# Patient Record
Sex: Female | Born: 1935 | Hispanic: No | Marital: Married | Smoking: Never smoker
Health system: Southern US, Community
[De-identification: ages and names within clinical notes are randomized; demographics above are authoritative.]

## PROBLEM LIST (undated history)

## (undated) DIAGNOSIS — M1990 Unspecified osteoarthritis, unspecified site: Secondary | ICD-10-CM

## (undated) HISTORY — PX: CHOLECYSTECTOMY: SHX55

## (undated) HISTORY — PX: ECTOPIC PREGNANCY SURGERY: SHX613

## (undated) HISTORY — PX: ABDOMINAL HYSTERECTOMY: SHX81

## (undated) HISTORY — DX: Unspecified osteoarthritis, unspecified site: M19.90

---

## 2005-01-22 ENCOUNTER — Emergency Department (HOSPITAL_COMMUNITY): Admission: EM | Admit: 2005-01-22 | Discharge: 2005-01-22 | Payer: Self-pay | Admitting: Emergency Medicine

## 2010-08-15 ENCOUNTER — Emergency Department (HOSPITAL_COMMUNITY): Payer: No Typology Code available for payment source

## 2010-08-15 ENCOUNTER — Emergency Department (HOSPITAL_COMMUNITY)
Admission: EM | Admit: 2010-08-15 | Discharge: 2010-08-15 | Disposition: A | Discharge status: Home / Self Care | Payer: No Typology Code available for payment source | Attending: Emergency Medicine | Admitting: Emergency Medicine

## 2010-08-15 DIAGNOSIS — R071 Chest pain on breathing: Secondary | ICD-10-CM | POA: Insufficient documentation

## 2010-08-15 DIAGNOSIS — S2239XA Fracture of one rib, unspecified side, initial encounter for closed fracture: Secondary | ICD-10-CM | POA: Insufficient documentation

## 2010-08-15 DIAGNOSIS — Y929 Unspecified place or not applicable: Secondary | ICD-10-CM | POA: Insufficient documentation

## 2010-08-15 DIAGNOSIS — Z7982 Long term (current) use of aspirin: Secondary | ICD-10-CM | POA: Insufficient documentation

## 2010-08-15 LAB — URINE MICROSCOPIC-ADD ON

## 2010-08-15 LAB — CBC
HCT: 44.7 % (ref 36.0–46.0)
Hemoglobin: 15.1 g/dL — ABNORMAL HIGH (ref 12.0–15.0)
MCV: 97.4 fL (ref 78.0–100.0)
Platelets: 243 10*3/uL (ref 150–400)
RBC: 4.59 MIL/uL (ref 3.87–5.11)
WBC: 11.2 10*3/uL — ABNORMAL HIGH (ref 4.0–10.5)

## 2010-08-15 LAB — URINALYSIS, ROUTINE W REFLEX MICROSCOPIC
Bilirubin Urine: NEGATIVE
Hgb urine dipstick: NEGATIVE
Ketones, ur: 15 mg/dL — AB
Urine Glucose, Fasting: NEGATIVE mg/dL
Urobilinogen, UA: 1 mg/dL (ref 0.0–1.0)

## 2010-08-15 LAB — DIFFERENTIAL
Basophils Absolute: 0.1 10*3/uL (ref 0.0–0.1)
Neutro Abs: 6.6 10*3/uL (ref 1.7–7.7)

## 2010-08-15 LAB — POCT I-STAT, CHEM 8
Chloride: 103 mEq/L (ref 96–112)
Creatinine, Ser: 0.8 mg/dL (ref 0.4–1.2)
Potassium: 4.5 mEq/L (ref 3.5–5.1)
Sodium: 141 mEq/L (ref 135–145)

## 2010-08-16 LAB — URINE CULTURE: Culture  Setup Time: 201202082249

## 2012-08-24 ENCOUNTER — Ambulatory Visit (INDEPENDENT_AMBULATORY_CARE_PROVIDER_SITE_OTHER): Payer: Medicare Other | Admitting: Family Medicine

## 2012-08-24 ENCOUNTER — Ambulatory Visit: Payer: Medicare Other

## 2012-08-24 VITALS — BP 134/82 | HR 132 | Temp 97.7°F | Resp 20 | Ht 64.25 in | Wt 131.4 lb

## 2012-08-24 DIAGNOSIS — M25559 Pain in unspecified hip: Secondary | ICD-10-CM

## 2012-08-24 DIAGNOSIS — M25551 Pain in right hip: Secondary | ICD-10-CM

## 2012-08-24 DIAGNOSIS — S7000XA Contusion of unspecified hip, initial encounter: Secondary | ICD-10-CM

## 2012-08-24 DIAGNOSIS — M1610 Unilateral primary osteoarthritis, unspecified hip: Secondary | ICD-10-CM

## 2012-08-24 NOTE — Patient Instructions (Addendum)
Take Tylenol 500 mg (acetaminophen) 2 pills every 6 hours if needed for pain  Be very careful to not take another fall  Plan or return if in all worse

## 2012-08-24 NOTE — Progress Notes (Signed)
Subjective: 79 year old lady who has a history of some vertigo. Today she stood up was, was dizzy, and fell backwards landing on her tailbone. That happened around in today. Her husband was unable to get her out. He brought her here to the office. His son came over and helped lift her. She's had a number of falls but has never broken anything. She does walk with a walker. She is deaf but quite alert. She did not injure anything else.  Objective: No obvious bruising. She is tender on the right hip area. The sacrum and coccyx do not seem to decrease tender. The left side is fine. She is able to bear weight. I laid her down and examined the hips and she has adequate range of motion with no obvious pain on movement.  Assessment: Contusion right hip  Plan: X-ray hip  [facility] reading (PRIMARY) by  Dr. Alwyn Ren Degenerative arthritis.  Although right hip has had a lot of arthritic changes, I do not see an obvious fracture. .  Will get a radiologist read the film. The patient is able to ambulate well with a walker I do not think it is fractured, but I will see what he says.. Radiologist did not see a fracture either. Treat symptomatically.

## 2013-03-12 ENCOUNTER — Emergency Department (HOSPITAL_COMMUNITY)
Admission: EM | Admit: 2013-03-12 | Discharge: 2013-03-12 | Disposition: A | Discharge status: Home / Self Care | Payer: Medicare Other | Attending: Emergency Medicine | Admitting: Emergency Medicine

## 2013-03-12 ENCOUNTER — Encounter (HOSPITAL_COMMUNITY): Payer: Self-pay

## 2013-03-12 ENCOUNTER — Emergency Department (HOSPITAL_COMMUNITY): Payer: Medicare Other

## 2013-03-12 DIAGNOSIS — W19XXXA Unspecified fall, initial encounter: Secondary | ICD-10-CM | POA: Insufficient documentation

## 2013-03-12 DIAGNOSIS — Y9302 Activity, running: Secondary | ICD-10-CM | POA: Insufficient documentation

## 2013-03-12 DIAGNOSIS — R94110 Abnormal electro-oculogram [EOG]: Secondary | ICD-10-CM | POA: Insufficient documentation

## 2013-03-12 DIAGNOSIS — S22009A Unspecified fracture of unspecified thoracic vertebra, initial encounter for closed fracture: Secondary | ICD-10-CM | POA: Insufficient documentation

## 2013-03-12 DIAGNOSIS — R9431 Abnormal electrocardiogram [ECG] [EKG]: Secondary | ICD-10-CM

## 2013-03-12 DIAGNOSIS — Y92009 Unspecified place in unspecified non-institutional (private) residence as the place of occurrence of the external cause: Secondary | ICD-10-CM | POA: Insufficient documentation

## 2013-03-12 DIAGNOSIS — Z8739 Personal history of other diseases of the musculoskeletal system and connective tissue: Secondary | ICD-10-CM | POA: Insufficient documentation

## 2013-03-12 DIAGNOSIS — R269 Unspecified abnormalities of gait and mobility: Secondary | ICD-10-CM | POA: Insufficient documentation

## 2013-03-12 LAB — COMPREHENSIVE METABOLIC PANEL
Albumin: 3.3 g/dL — ABNORMAL LOW (ref 3.5–5.2)
CO2: 26 mEq/L (ref 19–32)
Calcium: 9.1 mg/dL (ref 8.4–10.5)
Chloride: 100 mEq/L (ref 96–112)
Glucose, Bld: 111 mg/dL — ABNORMAL HIGH (ref 70–99)
Potassium: 3.2 mEq/L — ABNORMAL LOW (ref 3.5–5.1)
Sodium: 139 mEq/L (ref 135–145)

## 2013-03-12 LAB — CBC
HCT: 41.9 % (ref 36.0–46.0)
Hemoglobin: 14.1 g/dL (ref 12.0–15.0)
MCH: 32.7 pg (ref 26.0–34.0)
RBC: 4.31 MIL/uL (ref 3.87–5.11)

## 2013-03-12 LAB — POCT I-STAT, CHEM 8
Calcium, Ion: 1.13 mmol/L (ref 1.13–1.30)
Glucose, Bld: 107 mg/dL — ABNORMAL HIGH (ref 70–99)
Hemoglobin: 15.3 g/dL — ABNORMAL HIGH (ref 12.0–15.0)
Potassium: 3.5 mEq/L (ref 3.5–5.1)
TCO2: 29 mmol/L (ref 0–100)

## 2013-03-12 LAB — URINALYSIS, ROUTINE W REFLEX MICROSCOPIC
Nitrite: NEGATIVE
Specific Gravity, Urine: 1.016 (ref 1.005–1.030)
Urobilinogen, UA: 1 mg/dL (ref 0.0–1.0)

## 2013-03-12 LAB — POCT I-STAT TROPONIN I: Troponin i, poc: 0 ng/mL (ref 0.00–0.08)

## 2013-03-12 MED ORDER — HYDROCODONE-ACETAMINOPHEN 5-325 MG PO TABS: 1.0000 | ORAL_TABLET | ORAL | Status: DC | PRN

## 2013-03-12 MED ORDER — POTASSIUM CHLORIDE CRYS ER 20 MEQ PO TBCR
40.0000 meq | EXTENDED_RELEASE_TABLET | Freq: Once | ORAL | Status: AC
  Administered 2013-03-12: 40 meq via ORAL
  Filled 2013-03-12: qty 2

## 2013-03-12 NOTE — ED Notes (Signed)
Bed: ZO10 Expected date:  Expected time:  Means of arrival:  Comments: 79yo-fall

## 2013-03-12 NOTE — ED Notes (Signed)
cbg 115 

## 2013-03-12 NOTE — ED Notes (Signed)
Patient transported to X-ray 

## 2013-03-12 NOTE — ED Notes (Signed)
Patient unable to stand up long enough to get a standing BP

## 2013-03-12 NOTE — ED Notes (Signed)
No pain upon palpation of neck and spine. Intact c-collar placed by EMS.  Right knee is swollen. As long as pt is still she has no pain.

## 2013-03-12 NOTE — ED Notes (Signed)
Per EMS patient had 2 falls today witnessed by husband. States that husband said that her leg just kind of gave out and her knees buckled. No indication that she hit her head. No bruises or lumps noted on head. When patient moves she has lower back pain. Swelling and bruising noted on her right knee. States that the swelling on knee is chronic. Left leg looks as though rotated outward but no pain.

## 2013-03-12 NOTE — ED Provider Notes (Signed)
CSN: [id number]     Arrival date & time 03/12/13  0804 History   First MD Initiated Contact with Patient 03/12/13 0820     Chief Complaint  Patient presents with  . Fall   (Consider location/radiation/quality/duration/timing/severity/associated sxs/prior Treatment) Patient is a 79 y.o. female presenting with fall. The history is provided by the patient and the spouse.  Fall   patient here after sustaining a fall at home just prior to arrival. States that she was walking her legs became suddenly weak. She did not strike her head. She denies having loss of consciousness. Pain in her lower back is characterized as sharp and worse with movement. Denies any saddle anesthesia. No numbness or tingling in her legs. Denies any recent illnesses. Called EMS and was transported here. No treatment used prior to arrival  Past Medical History  Diagnosis Date  . Arthritis    Past Surgical History  Procedure Laterality Date  . Cholecystectomy    . Abdominal hysterectomy    . Ectopic pregnancy surgery     No family history on file. History  Substance Use Topics  . Smoking status: Never Smoker   . Smokeless tobacco: Never Used  . Alcohol Use: 0.6 oz/week    1 Glasses of wine per week     Comment: occassional   OB History   Grav Para Term Preterm Abortions TAB SAB Ect Mult Living                 Review of Systems  All other systems reviewed and are negative.    Allergies  Review of patient's allergies indicates no known allergies.  Home Medications   Current Outpatient Rx  Name  Route  Sig  Dispense  Refill  . acetaminophen (TYLENOL) 500 MG tablet   Oral   Take 500 mg by mouth every 6 (six) hours as needed for pain.          BP 142/67  Pulse 90  Temp(Src) 98.3 F (36.8 C) (Oral)  Resp 20  SpO2 95% Physical Exam  Nursing note and vitals reviewed. Constitutional: She is oriented to person, place, and time. She appears well-developed and well-nourished.  Non-toxic appearance.  No distress.  HENT:  Head: Normocephalic and atraumatic.  Eyes: Conjunctivae, EOM and lids are normal. Pupils are equal, round, and reactive to light.  Neck: Normal range of motion. Neck supple. No tracheal deviation present. No mass present.  Cardiovascular: Normal rate, regular rhythm and normal heart sounds.  Exam reveals no gallop.   No murmur heard. Pulmonary/Chest: Effort normal and breath sounds normal. No stridor. No respiratory distress. She has no decreased breath sounds. She has no wheezes. She has no rhonchi. She has no rales.  Abdominal: Soft. Normal appearance and bowel sounds are normal. She exhibits no distension. There is no tenderness. There is no rebound and no CVA tenderness.  Musculoskeletal: Normal range of motion. She exhibits no edema and no tenderness.       Back:  Neurological: She is alert and oriented to person, place, and time. She has normal strength. No cranial nerve deficit or sensory deficit. GCS eye subscore is 4. GCS verbal subscore is 5. GCS motor subscore is 6.  Skin: Skin is warm and dry. No abrasion and no rash noted.  Psychiatric: She has a normal mood and affect. Her speech is normal and behavior is normal.    ED Course  Procedures (including critical care time) Labs Review Labs Reviewed  URINALYSIS, ROUTINE W REFLEX  MICROSCOPIC   Imaging Review No results found.  MDM  No diagnosis found.  Date: 03/12/2013  Rate: 90  Rhythm: normal sinus rhythm  QRS Axis: normal  Intervals: normal  ST/T Wave abnormalities: nonspecific T wave changes  Conduction Disutrbances:none  Narrative Interpretation:   Old EKG Reviewed: none available   Patient's potassium replaced here. Patient has no cardiac complaints of at this time. He has not seen a physician for an over 40 years. Cardiac enzymes negative here. UA negative as well 2. Patient will be given cardiology referral for her abnormal EKG   Patient informed of x-ray results of her thoracic  spine.  Toy Baker, MD 03/12/13 [phone]

## 2013-07-09 ENCOUNTER — Emergency Department (HOSPITAL_COMMUNITY): Payer: Medicare Other

## 2013-07-09 ENCOUNTER — Encounter (HOSPITAL_COMMUNITY): Payer: Self-pay | Admitting: Emergency Medicine

## 2013-07-09 ENCOUNTER — Inpatient Hospital Stay (HOSPITAL_COMMUNITY)
Admission: EM | Admit: 2013-07-09 | Discharge: 2013-07-14 | DRG: 690 | Disposition: A | Discharge status: Skilled Nursing Facility | Payer: Medicare Other | Attending: Internal Medicine | Admitting: Internal Medicine

## 2013-07-09 DIAGNOSIS — N183 Chronic kidney disease, stage 3 unspecified: Secondary | ICD-10-CM | POA: Diagnosis present

## 2013-07-09 DIAGNOSIS — W19XXXD Unspecified fall, subsequent encounter: Secondary | ICD-10-CM

## 2013-07-09 DIAGNOSIS — I252 Old myocardial infarction: Secondary | ICD-10-CM

## 2013-07-09 DIAGNOSIS — I2510 Atherosclerotic heart disease of native coronary artery without angina pectoris: Secondary | ICD-10-CM

## 2013-07-09 DIAGNOSIS — R531 Weakness: Secondary | ICD-10-CM | POA: Diagnosis present

## 2013-07-09 DIAGNOSIS — R627 Adult failure to thrive: Secondary | ICD-10-CM | POA: Diagnosis present

## 2013-07-09 DIAGNOSIS — F0390 Unspecified dementia without behavioral disturbance: Secondary | ICD-10-CM | POA: Diagnosis present

## 2013-07-09 DIAGNOSIS — N390 Urinary tract infection, site not specified: Principal | ICD-10-CM

## 2013-07-09 DIAGNOSIS — A498 Other bacterial infections of unspecified site: Secondary | ICD-10-CM | POA: Diagnosis present

## 2013-07-09 DIAGNOSIS — J0130 Acute sphenoidal sinusitis, unspecified: Secondary | ICD-10-CM | POA: Diagnosis present

## 2013-07-09 DIAGNOSIS — R5381 Other malaise: Secondary | ICD-10-CM

## 2013-07-09 DIAGNOSIS — N6310 Unspecified lump in the right breast, unspecified quadrant: Secondary | ICD-10-CM

## 2013-07-09 DIAGNOSIS — N63 Unspecified lump in unspecified breast: Secondary | ICD-10-CM | POA: Diagnosis present

## 2013-07-09 DIAGNOSIS — E860 Dehydration: Secondary | ICD-10-CM

## 2013-07-09 DIAGNOSIS — G912 (Idiopathic) normal pressure hydrocephalus: Secondary | ICD-10-CM

## 2013-07-09 DIAGNOSIS — E46 Unspecified protein-calorie malnutrition: Secondary | ICD-10-CM

## 2013-07-09 DIAGNOSIS — Z79899 Other long term (current) drug therapy: Secondary | ICD-10-CM

## 2013-07-09 DIAGNOSIS — R5383 Other fatigue: Secondary | ICD-10-CM

## 2013-07-09 DIAGNOSIS — I700 Atherosclerosis of aorta: Secondary | ICD-10-CM | POA: Diagnosis present

## 2013-07-09 DIAGNOSIS — B9620 Unspecified Escherichia coli [E. coli] as the cause of diseases classified elsewhere: Secondary | ICD-10-CM

## 2013-07-09 DIAGNOSIS — J479 Bronchiectasis, uncomplicated: Secondary | ICD-10-CM

## 2013-07-09 DIAGNOSIS — R918 Other nonspecific abnormal finding of lung field: Secondary | ICD-10-CM

## 2013-07-09 DIAGNOSIS — R269 Unspecified abnormalities of gait and mobility: Secondary | ICD-10-CM | POA: Diagnosis present

## 2013-07-09 DIAGNOSIS — W19XXXA Unspecified fall, initial encounter: Secondary | ICD-10-CM

## 2013-07-09 DIAGNOSIS — I498 Other specified cardiac arrhythmias: Secondary | ICD-10-CM | POA: Diagnosis present

## 2013-07-09 DIAGNOSIS — M129 Arthropathy, unspecified: Secondary | ICD-10-CM | POA: Diagnosis present

## 2013-07-09 DIAGNOSIS — IMO0002 Reserved for concepts with insufficient information to code with codable children: Secondary | ICD-10-CM

## 2013-07-09 DIAGNOSIS — E440 Moderate protein-calorie malnutrition: Secondary | ICD-10-CM | POA: Diagnosis present

## 2013-07-09 LAB — URINALYSIS, ROUTINE W REFLEX MICROSCOPIC
Glucose, UA: NEGATIVE mg/dL
Ketones, ur: NEGATIVE mg/dL
Leukocytes, UA: NEGATIVE
NITRITE: NEGATIVE
PROTEIN: NEGATIVE mg/dL
SPECIFIC GRAVITY, URINE: 1.023 (ref 1.005–1.030)
UROBILINOGEN UA: 1 mg/dL (ref 0.0–1.0)
pH: 6.5 (ref 5.0–8.0)

## 2013-07-09 LAB — CBC WITH DIFFERENTIAL/PLATELET
BASOS ABS: 0 10*3/uL (ref 0.0–0.1)
BASOS PCT: 0 % (ref 0–1)
EOS ABS: 0 10*3/uL (ref 0.0–0.7)
EOS PCT: 0 % (ref 0–5)
HCT: 44 % (ref 36.0–46.0)
Hemoglobin: 14.7 g/dL (ref 12.0–15.0)
LYMPHS ABS: 1.1 10*3/uL (ref 0.7–4.0)
Lymphocytes Relative: 15 % (ref 12–46)
MCH: 32.7 pg (ref 26.0–34.0)
MCHC: 33.4 g/dL (ref 30.0–36.0)
MCV: 97.8 fL (ref 78.0–100.0)
Monocytes Absolute: 0.5 10*3/uL (ref 0.1–1.0)
Monocytes Relative: 7 % (ref 3–12)
NEUTROS PCT: 78 % — AB (ref 43–77)
Neutro Abs: 5.5 10*3/uL (ref 1.7–7.7)
PLATELETS: 196 10*3/uL (ref 150–400)
RBC: 4.5 MIL/uL (ref 3.87–5.11)
RDW: 13.1 % (ref 11.5–15.5)
WBC: 7.1 10*3/uL (ref 4.0–10.5)

## 2013-07-09 LAB — COMPREHENSIVE METABOLIC PANEL
ALBUMIN: 3.4 g/dL — AB (ref 3.5–5.2)
ALK PHOS: 68 U/L (ref 39–117)
ALT: 24 U/L (ref 0–35)
AST: 42 U/L — AB (ref 0–37)
BUN: 11 mg/dL (ref 6–23)
CALCIUM: 8.7 mg/dL (ref 8.4–10.5)
CO2: 27 mEq/L (ref 19–32)
Chloride: 95 mEq/L — ABNORMAL LOW (ref 96–112)
Creatinine, Ser: 0.57 mg/dL (ref 0.50–1.10)
GFR calc non Af Amer: 77 mL/min — ABNORMAL LOW (ref >90)
GFR, EST AFRICAN AMERICAN: 90 mL/min — AB (ref >90)
Glucose, Bld: 109 mg/dL — ABNORMAL HIGH (ref 70–99)
POTASSIUM: 3.8 meq/L (ref 3.7–5.3)
SODIUM: 135 meq/L — AB (ref 137–147)
TOTAL PROTEIN: 7.2 g/dL (ref 6.0–8.3)
Total Bilirubin: 0.5 mg/dL (ref 0.3–1.2)

## 2013-07-09 LAB — URINE MICROSCOPIC-ADD ON

## 2013-07-09 LAB — PROTIME-INR
INR: 0.97 (ref 0.00–1.49)
Prothrombin Time: 12.7 seconds (ref 11.6–15.2)

## 2013-07-09 LAB — POCT I-STAT TROPONIN I: TROPONIN I, POC: 0 ng/mL (ref 0.00–0.08)

## 2013-07-09 MED ORDER — SODIUM CHLORIDE 0.9 % IJ SOLN
3.0000 mL | Freq: Two times a day (BID) | INTRAMUSCULAR | Status: DC
  Administered 2013-07-10 – 2013-07-12 (×3): 3 mL via INTRAVENOUS

## 2013-07-09 MED ORDER — SODIUM CHLORIDE 0.9 % IV SOLN: 1000.0000 mL | INTRAVENOUS | Status: DC

## 2013-07-09 MED ORDER — BOOST PLUS PO LIQD
237.0000 mL | Freq: Three times a day (TID) | ORAL | Status: DC
  Administered 2013-07-10 – 2013-07-14 (×8): 237 mL via ORAL
  Filled 2013-07-09 (×15): qty 237

## 2013-07-09 MED ORDER — ENOXAPARIN SODIUM 40 MG/0.4ML ~~LOC~~ SOLN
40.0000 mg | Freq: Every day | SUBCUTANEOUS | Status: DC
  Administered 2013-07-10 – 2013-07-11 (×2): 40 mg via SUBCUTANEOUS
  Filled 2013-07-09 (×4): qty 0.4

## 2013-07-09 MED ORDER — ACETAMINOPHEN 650 MG RE SUPP
650.0000 mg | Freq: Four times a day (QID) | RECTAL | Status: DC | PRN
Start: 2013-07-09 — End: 2013-07-14

## 2013-07-09 MED ORDER — DOCUSATE SODIUM 100 MG PO CAPS
100.0000 mg | ORAL_CAPSULE | Freq: Two times a day (BID) | ORAL | Status: DC
  Administered 2013-07-10 – 2013-07-14 (×8): 100 mg via ORAL
  Filled 2013-07-09 (×11): qty 1

## 2013-07-09 MED ORDER — SODIUM CHLORIDE 0.9 % IV SOLN
1000.0000 mL | Freq: Once | INTRAVENOUS | Status: AC
  Administered 2013-07-09: 1000 mL via INTRAVENOUS

## 2013-07-09 MED ORDER — POTASSIUM CHLORIDE IN NACL 20-0.9 MEQ/L-% IV SOLN
INTRAVENOUS | Status: DC
  Administered 2013-07-10: 100 mL/h via INTRAVENOUS
  Administered 2013-07-10 – 2013-07-11 (×3): via INTRAVENOUS
  Administered 2013-07-13: 21:00:00 20 mL/h via INTRAVENOUS
  Filled 2013-07-09 (×6): qty 1000

## 2013-07-09 MED ORDER — ACETAMINOPHEN 325 MG PO TABS: 650.0000 mg | ORAL_TABLET | Freq: Four times a day (QID) | ORAL | Status: DC | PRN

## 2013-07-09 NOTE — ED Notes (Signed)
Patient had an incontinent brief on and had caked old stool and multiple incontinent urines. Patient has red buttocks and a small split in her coccyx area. Patient confused x 3 oriented to self only.

## 2013-07-09 NOTE — H&P (Signed)
[facility] History and Physical  Jeanette Hudson ZOX:[id number] DOB: Dec 12, 1917 DOA: 07/09/2013  Referring physician: Dr. Lynelle Doctor, ED PCP: Janace Hoard, MD (urgent care) Specialists: none  Chief Complaint: Weakness and fall  HPI: Jeanette Hudson is a 80 y.o. female  This is a delightful 80 year old lady who comes to [place] today with a one-day history of weakness. At baseline she and her husband live alone and their son lives 5 minutes away. She is able to get around with a walker and participate in her ADLs. This morning at 4 AM she got out of bed and her husband was helping her with the aid of her walker to the restroom. She became weak and "melted" to the floor. Her husband says she did not fall. It was more like she sat down. There was no head injury. The son came to the house within 10 minutes and got his mom back to bed. Patient went back to sleep. Mid morning when she woke up her husband helped her but we'll in her in dealing Rocker to her chair. She spent the day in the chair which is not abnormal for her. However in the late afternoon she was unable to get up. Her son came and tried to get her to stand up but she was unable to even help him to help her get up. At that time he thought she stable more confused and slightly lethargic. EMS was called the patient was transported to the emergency room.   Review of Systems: The patient denies anorexia, fever, weight loss,, vision loss, decreased hearing, hoarseness, chest pain, syncope, dyspnea on exertion, peripheral edema, balance deficits, hemoptysis, abdominal pain, melena, hematochezia, severe indigestion/heartburn, hematuria, incontinence, genital sores, suspicious skin lesions, transient blindness, difficulty walking, depression, unusual weight change, abnormal bleeding, enlarged lymph nodes, angioedema, and breast masses.    Past Medical History  Diagnosis Date  . Arthritis    Past Surgical History  Procedure Laterality Date  .  Cholecystectomy    . Abdominal hysterectomy    . Ectopic pregnancy surgery     Social History:  reports that she has never smoked. She has never used smokeless tobacco. She reports that she drinks about 0.6 ounces of alcohol per week. She reports that she does not use illicit drugs. Patient lives with her husband. Family is nearby. She does most of her ADLs by herself and husband and son help her with her  No Known Allergies  History reviewed. No pertinent family history.   Prior to Admission medications   Medication Sig Start Date End Date Taking? Authorizing Provider  acetaminophen (TYLENOL) 500 MG tablet Take 500 mg by mouth every 6 (six) hours as needed for pain.   Yes Historical Provider, MD  POTASSIUM PO Take 1 tablet by mouth daily. Over the counter.   Yes Historical Provider, MD   Physical Exam: Filed Vitals:   07/09/13 1929  BP: 123/64  Pulse: 80  Temp: 98.3 F (36.8 C)  Resp: 16    Nursing note and vitals reviewed. Constitutional: She is oriented to person, place, and time. She appears well-developed and well-nourished.  HENT:  Nose: Nose normal.  Mouth/Throat: Oropharynx is clear and moist. No oropharyngeal exudate.  Eyes: Conjunctivae are normal. Pupils are equal, round, and reactive to light.  Neck: Normal range of motion. Neck supple. No thyromegaly present.  Cardiovascular: Normal rate, regular rhythm and normal heart sounds.   Pulmonary/Chest: Effort normal and breath sounds normal.  Abdominal: Soft. Bowel sounds are normal. She exhibits no  distension. There is no tenderness. There is no rebound.  Lymphadenopathy:    She has no cervical adenopathy.  Neurological: She is alert and oriented to person She has normal reflexes.  she is not able to toe may where she is, the [place], or the month Skin: Skin is warm and dry.  Psychiatric: She has a normal mood and affect. Her behavior is normal.    Labs on Admission:  Basic Metabolic  Panel:  Recent Labs Lab 07/09/13 1900  NA 135*  K 3.8  CL 95*  CO2 27  GLUCOSE 109*  BUN 11  CREATININE 0.57  CALCIUM 8.7   Liver Function Tests:  Recent Labs Lab 07/09/13 1900  AST 42*  ALT 24  ALKPHOS 68  BILITOT 0.5  PROT 7.2  ALBUMIN 3.4*   No results found for this basename: LIPASE, AMYLASE,  in the last 168 hours No results found for this basename: AMMONIA,  in the last 168 hours CBC:  Recent Labs Lab 07/09/13 1900  WBC 7.1  NEUTROABS 5.5  HGB 14.7  HCT 44.0  MCV 97.8  PLT 196   Cardiac Enzymes: No results found for this basename: CKTOTAL, CKMB, CKMBINDEX, TROPONINI,  in the last 168 hours  BNP (last 3 results) No results found for this basename: PROBNP,  in the last 8760 hours CBG: No results found for this basename: GLUCAP,  in the last 168 hours  Radiological Exams on Admission: Dg Chest 2 View  07/09/2013   CLINICAL DATA:  Fall, nonsmoker  EXAM: CHEST  2 VIEW  COMPARISON:  08/15/2010  FINDINGS: Heart size is normal. Vascular pattern is normal. There is aortic calcification. No consolidation or effusion. In the retrocardiac left lower lobe there are 2 nodular opacities which are not seen on the prior study, both measuring about 7 mm.  IMPRESSION: No acute findings. However, cannot exclude pulmonary nodules in the left lower lobe. Consider CT thorax.   Electronically Signed   By: Esperanza Heiraymond  Rubner M.D.   On: 07/09/2013 19:04   Ct Head Wo Contrast  07/09/2013   CLINICAL DATA:  Fall. Abrasion to the left cheek. Weakness. Altered level of consciousness.  EXAM: CT HEAD WITHOUT CONTRAST  TECHNIQUE: Contiguous axial images were obtained from the base of the skull through the vertex without intravenous contrast.  COMPARISON:  03/12/2013.  FINDINGS: The brainstem, cerebellum, cerebral peduncles, thalamus, basal ganglia, basilar cisterns, and ventricular system appear within normal limits. Periventricular white matter and corona radiata hypodensities favor chronic  ischemic microvascular white matter disease. No intracranial hemorrhage, mass lesion, or acute CVA. Chronic maxillary and ethmoid sinusitis noted with acute right sphenoid sinusitis.  IMPRESSION: 1. No acute intracranial findings. 2. Periventricular white matter and corona radiata hypodensities favor chronic ischemic microvascular white matter disease. 3. Acute sphenoid sinusitis with chronic maxillary and ethmoid sinusitis.   Electronically Signed   By: Herbie BaltimoreWalt  Liebkemann M.D.   On: 07/09/2013 19:08    EKG: Independently reviewed.sinus with lad  Assessment/Plan Active Problems:   Weakness   Fall   Protein calorie malnutrition   Mild dehydration   Pulmonary nodules   I am concerned that this may patient may have had a stroke given the abruptness of the new symptoms. Will get an MRI and would consider carotid Dopplers, TCD, echo and statin.  Will have physical therapy see her, and exercise fall precautions. Will supplement what she eats with boost shakes to help the protein calorie malnutrition. Overnight we'll give her some  IV fluids to help with the mild dehydration.  On chest x-ray there were some pulmonary nodules and a CT was recommended to follow this up. I have ordered the CT thorax for the left lower lobe pulmonary nodules.   Code Status: Full Family Communication: Spoke with the son and the patient's husband Disposition Plan: Admit to telemetry in particular to monitor for any occult arrhythmias  Time spent: 1 hour  Acey Lav [facility] Pager [phone]  If 7PM-7AM, please contact night-coverage [email or web address] Password [email or web address] 07/09/2013, 11:11 PM

## 2013-07-09 NOTE — ED Provider Notes (Signed)
CSN: [id number]     Arrival date & time 07/09/13  1709 History   First MD Initiated Contact with Patient 07/09/13 1809     Chief Complaint  Patient presents with  . Fall  . Weakness    HPI Pt resides at home with family.  She has been declining over years but last night she did not sleep well until 4am.  At some point in the night she slipped and fell.  Family members helped her get back into bed.  No vomiting or diarrhea.  No chest pain or abdominal pain but she has been complaining of having pain all over.  Later in the day she was helped to sit in a chair.   She called ask for help to go to the bathroom. She was unable to get to the bathroom today with assistance and she was incontinent. Family called 911 to have her brought to the emergency room for evaluation. Patient does admit to having some cough. She has not noticed any urinary frequency.  EMS found the patient to be smelling of urine. No fevers.   Past Medical History  Diagnosis Date  . Arthritis    Past Surgical History  Procedure Laterality Date  . Cholecystectomy    . Abdominal hysterectomy    . Ectopic pregnancy surgery     History reviewed. No pertinent family history. History  Substance Use Topics  . Smoking status: Never Smoker   . Smokeless tobacco: Never Used  . Alcohol Use: 0.6 oz/week    1 Glasses of wine per week     Comment: occassional   OB History   Grav Para Term Preterm Abortions TAB SAB Ect Mult Living                 Review of Systems  Constitutional: Positive for fatigue.  Respiratory: Negative for cough and shortness of breath.   Gastrointestinal: Negative for vomiting and diarrhea.  All other systems reviewed and are negative.    Allergies  Review of patient's allergies indicates no known allergies.  Home Medications   Current Outpatient Rx  Name  Route  Sig  Dispense  Refill  . acetaminophen (TYLENOL) 500 MG tablet   Oral   Take 500 mg by mouth every 6 (six) hours as needed for  pain.         Marland Kitchen POTASSIUM PO   Oral   Take 1 tablet by mouth daily. Over the counter.          BP 123/64  Pulse 80  Temp(Src) 98.3 F (36.8 C) (Oral)  Resp 16  SpO2 93% Physical Exam  Nursing note and vitals reviewed. Constitutional: No distress.  HENT:  Head: Normocephalic and atraumatic.  Right Ear: External ear normal.  Left Ear: External ear normal.  Mouth/Throat: No oropharyngeal exudate (mucous membranes are dry).  Eyes: Conjunctivae are normal. Right eye exhibits no discharge. Left eye exhibits no discharge. No scleral icterus.  Neck: Neck supple. No tracheal deviation present.  Cardiovascular: Normal rate, regular rhythm and intact distal pulses.   Pulmonary/Chest: Effort normal and breath sounds normal. No stridor. No respiratory distress. She has no wheezes. She has no rales.  Abdominal: Soft. Bowel sounds are normal. She exhibits no distension. There is no tenderness. There is no rebound and no guarding.  Genitourinary:  No decubitus ulcers noted, dry stool into the skin noted in the perineum  Musculoskeletal: She exhibits no edema and no tenderness.  Neurological: She is alert. No sensory deficit.  Cranial nerve deficit: No facial droop, extraocular movements intact. She exhibits normal muscle tone. She displays no seizure activity. Coordination abnormal.   General weakness, patient has some difficulty rolling onto her side on her own, unable to stand even with assistance  Skin: Skin is warm and dry. No rash noted. She is not diaphoretic.  Psychiatric: She has a normal mood and affect.    ED Course  Procedures (including critical care time) Labs Review Labs Reviewed  URINALYSIS, ROUTINE W REFLEX MICROSCOPIC - Abnormal; Notable for the following:    Color, Urine AMBER (*)    Hgb urine dipstick TRACE (*)    Bilirubin Urine SMALL (*)    All other components within normal limits  CBC WITH DIFFERENTIAL - Abnormal; Notable for the following:    Neutrophils  Relative % 78 (*)    All other components within normal limits  COMPREHENSIVE METABOLIC PANEL - Abnormal; Notable for the following:    Sodium 135 (*)    Chloride 95 (*)    Glucose, Bld 109 (*)    Albumin 3.4 (*)    AST 42 (*)    GFR calc non Af Amer 77 (*)    GFR calc Af Amer 90 (*)    All other components within normal limits  PROTIME-INR  URINE MICROSCOPIC-ADD ON  POCT I-STAT TROPONIN I   Imaging Review Dg Chest 2 View  07/09/2013   CLINICAL DATA:  Fall, nonsmoker  EXAM: CHEST  2 VIEW  COMPARISON:  08/15/2010  FINDINGS: Heart size is normal. Vascular pattern is normal. There is aortic calcification. No consolidation or effusion. In the retrocardiac left lower lobe there are 2 nodular opacities which are not seen on the prior study, both measuring about 7 mm.  IMPRESSION: No acute findings. However, cannot exclude pulmonary nodules in the left lower lobe. Consider CT thorax.   Electronically Signed   By: Esperanza Heir M.D.   On: 07/09/2013 19:04   Ct Head Wo Contrast  07/09/2013   CLINICAL DATA:  Fall. Abrasion to the left cheek. Weakness. Altered level of consciousness.  EXAM: CT HEAD WITHOUT CONTRAST  TECHNIQUE: Contiguous axial images were obtained from the base of the skull through the vertex without intravenous contrast.  COMPARISON:  03/12/2013.  FINDINGS: The brainstem, cerebellum, cerebral peduncles, thalamus, basal ganglia, basilar cisterns, and ventricular system appear within normal limits. Periventricular white matter and corona radiata hypodensities favor chronic ischemic microvascular white matter disease. No intracranial hemorrhage, mass lesion, or acute CVA. Chronic maxillary and ethmoid sinusitis noted with acute right sphenoid sinusitis.  IMPRESSION: 1. No acute intracranial findings. 2. Periventricular white matter and corona radiata hypodensities favor chronic ischemic microvascular white matter disease. 3. Acute sphenoid sinusitis with chronic maxillary and ethmoid  sinusitis.   Electronically Signed   By: Herbie Baltimore M.D.   On: 07/09/2013 19:08    EKG Interpretation    Date/Time:  Friday July 09 2013 19:06:19 EST Ventricular Rate:  83 PR Interval:  134 QRS Duration: 105 QT Interval:  377 QTC Calculation: 443 R Axis:   -39 Text Interpretation:  Sinus rhythm Left axis deviation Borderline repolarization abnormality Artifact in lead(s) I II aVR aVL aVF Confirmed by Skylene Deremer  MD-J, Lenore Moyano ([id number]) on 07/09/2013 7:11:49 PM           2125  Pt was unable to walk with assistance MDM   1. Weakness   2. Fall, initial encounter    No obvious source of infection at this time.  Pt  is weak and unable to walk.  Lives home with her elderly husband independently.  Will consult with medicine for possible PT/OT possible mri to evaluate for occult stroke.   Celene KrasJon R Cymone Yeske, MD 07/09/13 2126

## 2013-07-09 NOTE — ED Notes (Signed)
Pt unable to ambulate d/t weakness

## 2013-07-09 NOTE — Progress Notes (Signed)
   CARE MANAGEMENT ED NOTE 07/09/2013  Patient:  Tammy SoursSPENCER,Aayliah   Account Number:  [id number]  Date Initiated:  07/09/2013  Documentation initiated by:  Edd ArbourGIBBS,KIMBERLY  Subjective/Objective Assessment:   80 yr old medicare [place] pt from home. Patient fell at 0400 this AM with no injuries. Patient has been slightly confused, foul-smelling urine, and weakness.     Subjective/Objective Assessment Detail:   ED RN reports incontinent brief on and had caked old stool and multiple incontinent urines. Patient has red buttocks and a small split in her coccyx area. Patient confused x 3 oriented to self only.    Pt reports pcp  [facility] Pt has Environmental consultantWalker at home she generally uses per son and husband at the bedside Son confirms pt and husband lives alone but he & members of his family visit frequently to provide care Home health services/DME refused Cm encouraged pt to have pcp assist if further DME or Home health is needed     Action/Plan:   Cm spoke with ED RNS, EDP, pt, her husband and her son at bedside CM reviewed in details medicare guidelines, home health [facility] (length of stay in home, [facility], coverage, primary caregiver, up to 24 hrs before services   Action/Plan Detail:   may be started), Private duty nursing (PDN-coverage, length of stay in the home types of staff available). CM provided family with a list of [place] home health agencies, PDN. Cm left her contact number for further questions   Anticipated DC Date:  07/09/2013     Status Recommendation to Physician:   Result of Recommendation:    Other ED Services  Consult Working Plan    DC Planning Services  Other  PCP issues  Outpatient Services - Pt will follow up  Patient refused services    Choice offered to / List presented to:            Status of service:  Completed, signed off  ED Comments:   ED Comments Detail:

## 2013-07-09 NOTE — ED Notes (Signed)
Patient transported to X-ray 

## 2013-07-09 NOTE — ED Notes (Signed)
Bed: ZO10WA22 Expected date:  Expected time:  Means of arrival:  Comments: EMS-UTI-FALL

## 2013-07-09 NOTE — ED Notes (Signed)
Per EMS- Patient from home. Patient fell at 0400 this AM with no injuries. Patient has been slightly confused, foul-smelling urine, and weakness.

## 2013-07-10 ENCOUNTER — Observation Stay (HOSPITAL_COMMUNITY): Payer: Medicare Other

## 2013-07-10 DIAGNOSIS — E46 Unspecified protein-calorie malnutrition: Secondary | ICD-10-CM

## 2013-07-10 DIAGNOSIS — N6310 Unspecified lump in the right breast, unspecified quadrant: Secondary | ICD-10-CM

## 2013-07-10 DIAGNOSIS — J479 Bronchiectasis, uncomplicated: Secondary | ICD-10-CM

## 2013-07-10 DIAGNOSIS — W19XXXA Unspecified fall, initial encounter: Secondary | ICD-10-CM

## 2013-07-10 LAB — CBC
HEMATOCRIT: 36.4 % (ref 36.0–46.0)
HEMATOCRIT: 39.8 % (ref 36.0–46.0)
HEMOGLOBIN: 12.1 g/dL (ref 12.0–15.0)
Hemoglobin: 12.9 g/dL (ref 12.0–15.0)
MCH: 32.4 pg (ref 26.0–34.0)
MCH: 31.5 pg (ref 26.0–34.0)
MCHC: 33.2 g/dL (ref 30.0–36.0)
MCHC: 32.4 g/dL (ref 30.0–36.0)
MCV: 97.6 fL (ref 78.0–100.0)
MCV: 97.3 fL (ref 78.0–100.0)
Platelets: 168 10*3/uL (ref 150–400)
Platelets: 151 10*3/uL (ref 150–400)
RBC: 4.09 MIL/uL (ref 3.87–5.11)
RBC: 3.73 MIL/uL — ABNORMAL LOW (ref 3.87–5.11)
RDW: 13.1 % (ref 11.5–15.5)
RDW: 13.1 % (ref 11.5–15.5)
WBC: 5.7 10*3/uL (ref 4.0–10.5)
WBC: 4.1 10*3/uL (ref 4.0–10.5)

## 2013-07-10 LAB — BASIC METABOLIC PANEL
BUN: 10 mg/dL (ref 6–23)
CO2: 25 mEq/L (ref 19–32)
CREATININE: 0.58 mg/dL (ref 0.50–1.10)
Calcium: 8 mg/dL — ABNORMAL LOW (ref 8.4–10.5)
Chloride: 102 mEq/L (ref 96–112)
GFR calc Af Amer: 89 mL/min — ABNORMAL LOW (ref >90)
GFR calc non Af Amer: 77 mL/min — ABNORMAL LOW (ref >90)
Glucose, Bld: 85 mg/dL (ref 70–99)
Potassium: 3.3 mEq/L — ABNORMAL LOW (ref 3.7–5.3)
Sodium: 138 mEq/L (ref 137–147)

## 2013-07-10 LAB — TSH: TSH: 1.04 u[IU]/mL (ref 0.350–4.500)

## 2013-07-10 LAB — LACTIC ACID, PLASMA: Lactic Acid, Venous: 1.1 mmol/L (ref 0.5–2.2)

## 2013-07-10 LAB — MAGNESIUM: Magnesium: 1.9 mg/dL (ref 1.5–2.5)

## 2013-07-10 LAB — CREATININE, SERUM
Creatinine, Ser: 0.57 mg/dL (ref 0.50–1.10)
GFR calc Af Amer: 90 mL/min — ABNORMAL LOW (ref >90)
GFR calc non Af Amer: 77 mL/min — ABNORMAL LOW (ref >90)

## 2013-07-10 LAB — PHOSPHORUS: Phosphorus: 2.9 mg/dL (ref 2.3–4.6)

## 2013-07-10 LAB — CALCIUM: CALCIUM: 8.2 mg/dL — AB (ref 8.4–10.5)

## 2013-07-10 MED ORDER — INFLUENZA VAC SPLIT QUAD 0.5 ML IM SUSP
0.5000 mL | INTRAMUSCULAR | Status: DC
  Filled 2013-07-10 (×2): qty 0.5

## 2013-07-10 MED ORDER — PNEUMOCOCCAL VAC POLYVALENT 25 MCG/0.5ML IJ INJ
0.5000 mL | INJECTION | INTRAMUSCULAR | Status: DC
  Filled 2013-07-10 (×2): qty 0.5

## 2013-07-10 MED ORDER — BIOTENE DRY MOUTH MT LIQD
15.0000 mL | Freq: Two times a day (BID) | OROMUCOSAL | Status: DC
  Administered 2013-07-10 – 2013-07-14 (×9): 15 mL via OROMUCOSAL

## 2013-07-10 MED ORDER — POTASSIUM CHLORIDE CRYS ER 20 MEQ PO TBCR
40.0000 meq | EXTENDED_RELEASE_TABLET | Freq: Once | ORAL | Status: AC
  Administered 2013-07-10: 40 meq via ORAL
  Filled 2013-07-10: qty 2

## 2013-07-10 NOTE — Progress Notes (Signed)
[facility] PROGRESS NOTE  Jeanette Hudson VHQ:[id number] DOB: December 15, 1917 DOA: 07/09/2013 PCP: Janace Hoard, MD  Assessment/Plan  Sudden onset weakness with fall, patient not cooperative with exam.  Concern for possible stroke.  UA neg, CXR without acute disease -  MRI pending -  Will order ECHO and carotid duplex -  PT/OT evaluated and recommended SNF, however pt and husband declined both SNF and home health services.  She is very unsteady and at risk for falls.    CT chest concerning for enlarging right upper lateral breast lesion, patient refused to discuss today so will bring up again tomorrow -  Outpatient breast US and mammography  Bronchiectasis LLL  Atherosclerosis of the aorta  Acute sinusitis of the right sphenoid sinus, but patient denies symptoms -  Symptomatic care  CKD stage 3, minimize nephrotoxins and renally dose medications  Diet:  regular Access:  PIV IVF:  yes Proph:  lovenox  Code Status: full code Family Communication:  Patient alone Disposition Plan: pending MRI brain   Consultants:  None  Procedures:  CT head  CT chest  CXR  Antibiotics:  None   HPI/Subjective:  Patient refuses to answer questions today.  Initially declined exam and when I returned to examine her, she was uncooperative.      Objective: Filed Vitals:   07/10/13 0300 07/10/13 0543 07/10/13 1100 07/10/13 1455  BP:  117/52  91/79  Pulse:  63  72  Temp:  97.1 F (36.2 C) 97.5 F (36.4 C) 97.8 F (36.6 C)  TempSrc:  Axillary  Oral  Resp:  16  18  Height: 5\' 2"  (1.575 m)     Weight:      SpO2:  93%  98%    Intake/Output Summary (Last 24 hours) at 07/10/13 1703 Last data filed at 07/10/13 1300  Gross per 24 hour  Intake 483.33 ml  Output      0 ml  Net 483.33 ml   Filed Weights   07/09/13 2332  Weight: 55.1 kg (121 lb 7.6 oz)    Exam:   General:  CF, No acute distress, eyes firmly shut, not answering questions.    HEENT:  NCAT,  MMM  Cardiovascular:  RRR, nl S1, S2 no mrg, 2+ pulses, warm extremities  Respiratory:  CTAB, no increased WOB  Abdomen:   NABS, soft, NT/ND  MSK:   Normal tone and bulk, no LEE  Neuro:  Grossly moves all extremities, but currently in LL decub position  Data Reviewed: Basic Metabolic Panel:  Recent Labs Lab 07/09/13 1900 07/09/13 2359 07/10/13 0741  NA 135*  --  138  K 3.8  --  3.3*  CL 95*  --  102  CO2 27  --  25  GLUCOSE 109*  --  85  BUN 11  --  10  CREATININE 0.57 0.57 0.58  CALCIUM 8.7 8.2* 8.0*  MG  --  1.9  --   PHOS  --  2.9  --    Liver Function Tests:  Recent Labs Lab 07/09/13 1900  AST 42*  ALT 24  ALKPHOS 68  BILITOT 0.5  PROT 7.2  ALBUMIN 3.4*   No results found for this basename: LIPASE, AMYLASE,  in the last 168 hours No results found for this basename: AMMONIA,  in the last 168 hours CBC:  Recent Labs Lab 07/09/13 1900 07/09/13 2359 07/10/13 0741  WBC 7.1 5.7 4.1  NEUTROABS 5.5  --   --   HGB 14.7 12.9 12.1  HCT 44.0 39.8 36.4  MCV 97.8 97.3 97.6  PLT 196 168 151   Cardiac Enzymes: No results found for this basename: CKTOTAL, CKMB, CKMBINDEX, TROPONINI,  in the last 168 hours BNP (last 3 results) No results found for this basename: PROBNP,  in the last 8760 hours CBG: No results found for this basename: GLUCAP,  in the last 168 hours  No results found for this or any previous visit (from the past 240 hour(s)).   Studies: Dg Chest 2 View  07/09/2013   CLINICAL DATA:  Fall, nonsmoker  EXAM: CHEST  2 VIEW  COMPARISON:  08/15/2010  FINDINGS: Heart size is normal. Vascular pattern is normal. There is aortic calcification. No consolidation or effusion. In the retrocardiac left lower lobe there are 2 nodular opacities which are not seen on the prior study, both measuring about 7 mm.  IMPRESSION: No acute findings. However, cannot exclude pulmonary nodules in the left lower lobe. Consider CT thorax.   Electronically Signed   By: Esperanza Heir M.D.   On: 07/09/2013 19:04   Ct Head Wo Contrast  07/09/2013   CLINICAL DATA:  Fall. Abrasion to the left cheek. Weakness. Altered level of consciousness.  EXAM: CT HEAD WITHOUT CONTRAST  TECHNIQUE: Contiguous axial images were obtained from the base of the skull through the vertex without intravenous contrast.  COMPARISON:  03/12/2013.  FINDINGS: The brainstem, cerebellum, cerebral peduncles, thalamus, basal ganglia, basilar cisterns, and ventricular system appear within normal limits. Periventricular white matter and corona radiata hypodensities favor chronic ischemic microvascular white matter disease. No intracranial hemorrhage, mass lesion, or acute CVA. Chronic maxillary and ethmoid sinusitis noted with acute right sphenoid sinusitis.  IMPRESSION: 1. No acute intracranial findings. 2. Periventricular white matter and corona radiata hypodensities favor chronic ischemic microvascular white matter disease. 3. Acute sphenoid sinusitis with chronic maxillary and ethmoid sinusitis.   Electronically Signed   By: Herbie Baltimore M.D.   On: 07/09/2013 19:08   Ct Chest High Resolution  07/10/2013   CLINICAL DATA:  Pulmonary nodules.  EXAM: CHEST CT WITHOUT CONTRAST  TECHNIQUE: Multidetector CT imaging of the chest was performed following the standard protocol without intravenous contrast. High resolution imaging of the lungs, as well as inspiratory and expiratory imaging, was performed.  COMPARISON:  08/15/2010  FINDINGS: The chest wall demonstrates an enlarging nodular mass in the right lateral breast. Recommend correlation with physical exam, mammography and ultrasound. No axillary adenopathy or supraclavicular adenopathy. The bony thorax is intact. Severe osteoporosis and severe degenerative disease involving the spine. Probable remote healed sternal fracture. Remote healed rib fractures are also noted.  The heart is normal in size. No pericardial effusion. There is tortuosity, ectasia and advanced  atherosclerotic calcification involving the aorta and branch vessels. There are borderline enlarged mediastinal lymph nodes most notable in the aortic 0 pulmonary window and sub carina. The esophagus is grossly normal.  Examination of the lung parenchyma demonstrates no worrisome pulmonary nodules or masses. There is progressive peribronchial disease in the left lower lobe and bronchiectasis. No pleural effusion.  The upper abdomen is unremarkable. Severe atherosclerotic disease involving the aorta is noted.  IMPRESSION: Enlarging right upper lateral breast lesion. Recommend correlation with physical exam, ultrasound and mammography.  No worrisome pulmonary masses or nodules.  Worsening left lower lobe peribronchial disease and bronchiectasis.  Advanced atherosclerotic disease involving the aorta and branch vessels.  Borderline enlarged mediastinal lymph nodes.   Electronically Signed   By: Loralie Champagne M.D.   On:  07/10/2013 01:27    Scheduled Meds: . antiseptic oral rinse  15 mL Mouth Rinse BID  . docusate sodium  100 mg Oral BID  . enoxaparin (LOVENOX) injection  40 mg Subcutaneous QHS  . [START ON 07/11/2013] influenza vac split quadrivalent PF  0.5 mL Intramuscular Tomorrow-1000  . lactose free nutrition  237 mL Oral TID WC  . [START ON 07/11/2013] pneumococcal 23 valent vaccine  0.5 mL Intramuscular Tomorrow-1000  . sodium chloride  3 mL Intravenous Q12H   Continuous Infusions: . sodium chloride    . 0.9 % NaCl with KCl 20 mEq / L 100 mL/hr (07/10/13 1153)    Active Problems:   Weakness   Fall   Protein calorie malnutrition   Mild dehydration   Pulmonary nodules    Time spent: 30 min    Jashawn Floyd  [facility] Pager [phone]. If 7PM-7AM, please contact night-coverage at [email or web address], password [email or web address] 07/10/2013, 5:03 PM  LOS: 1 day

## 2013-07-10 NOTE — Progress Notes (Signed)
Utilization Review completed.  

## 2013-07-10 NOTE — Progress Notes (Signed)
PT Cancellation Note  Patient Details Name: Jeanette Hudson MRN: [id number] DOB: 09/13/1917   Cancelled Treatment:    Reason Eval/Treat Not Completed: Patient declined, no reason specified. Will check back another time. Thanks.    Rebeca AlertJannie Amorah Sebring, MPT Pager: [phone]

## 2013-07-10 NOTE — Progress Notes (Signed)
VASCULAR LAB PRELIMINARY  PRELIMINARY  PRELIMINARY  PRELIMINARY  Carotid Dopplers completed.    Preliminary report:  1-39% ICA stenosis.  Vertebral artery flow is antegrade.  Orli Degrave, RVT 07/10/2013, 6:11 PM

## 2013-07-10 NOTE — Evaluation (Signed)
Occupational Therapy Evaluation Patient Details Name: Jeanette Hudson MRN: [id number] DOB: September 21, 1917 Today's Date: 07/10/2013 Time: [id number] OT Time Calculation (min): 23 min  OT Assessment / Plan / Recommendation History of present illness This is a delightful 80 year old lady who comes to us today with a one-day history of weakness. At baseline she and her husband live alone and their son lives 5 minutes away. She is able to get around with a walker and participate in her ADLs. This morning at 4 AM she got out of bed and her husband was helping her with the aid of her walker to the restroom. She became weak and "melted" to the floor. Her husband says she did not fall. It was more like she sat down. There was no head injury. The son came to the house within 10 minutes and got his mom back to bed. Patient went back to sleep. Mid morning when she woke up her husband helped her but we'll in her in dealing Rocker to her chair. She spent the day in the chair which is not abnormal for her. However in the late afternoon she was unable to get up. Her son came and tried to get her to stand up but she was unable to even help him to help her get up. At that time he thought she stable more confused and slightly lethargic. EMS was called the patient was transported to the emergency room   Clinical Impression   Pt presents to OT with decreased I with ADL activity and will benefit from skilled OT to increase I with ADL activity and return to PLOF of living at home with spouse   OT Assessment  Patient needs continued OT Services    Follow Up Recommendations  SNF;Other (comment) (pt and husband not interested in SNF-but OT did present ioption)       Equipment Recommendations  3 in 1 bedside comode       Frequency  Min 2X/week    Precautions / Restrictions Precautions Precautions: Fall Precaution Comments: high fall risk Restrictions Weight Bearing Restrictions: No       ADL  Grooming: Minimal  assistance Where Assessed - Grooming: Unsupported sitting Upper Body Bathing: Minimal assistance Where Assessed - Upper Body Bathing: Unsupported sitting Lower Body Bathing: Maximal assistance Where Assessed - Lower Body Bathing: Supported sit to stand Upper Body Dressing: Minimal assistance Where Assessed - Upper Body Dressing: Unsupported sitting Lower Body Dressing: Maximal assistance Where Assessed - Lower Body Dressing: Supported sit to stand Toilet Transfer: Minimal assistance Toilet Transfer Method: Sit to stand Toileting - Clothing Manipulation and Hygiene: Moderate assistance Where Assessed - Toileting Clothing Manipulation and Hygiene: Standing ADL Comments: husband will provide A with ADL activity     OT Diagnosis: Generalized weakness  OT Problem List: Decreased strength;Impaired balance (sitting and/or standing);Decreased safety awareness OT Treatment Interventions: Self-care/ADL training;DME and/or AE instruction;Patient/family education   OT Goals(Current goals can be found in the care plan section) Acute Rehab OT Goals Patient Stated Goal: home with husband OT Goal Formulation: With patient Time For Goal Achievement: 07/24/13 Potential to Achieve Goals: Good ADL Goals Pt Will Perform Grooming: with set-up;sitting Pt Will Perform Upper Body Bathing: with supervision;sitting Pt Will Perform Lower Body Bathing: with min assist;sit to/from stand Pt Will Perform Upper Body Dressing: with supervision;sitting Pt Will Perform Lower Body Dressing: with min guard assist;sit to/from stand Pt Will Transfer to Toilet: with min guard assist Pt Will Perform Toileting - Clothing Manipulation and hygiene: with min assist;sit  to/from stand  Visit Information  Last OT Received On: 07/10/13 Assistance Needed: +1 History of Present Illness: This is a delightful 80 year old lady who comes to [place] today with a one-day history of weakness. At baseline she and her husband live alone and  their son lives 5 minutes away. She is able to get around with a walker and participate in her ADLs. This morning at 4 AM she got out of bed and her husband was helping her with the aid of her walker to the restroom. She became weak and "melted" to the floor. Her husband says she did not fall. It was more like she sat down. There was no head injury. The son came to the house within 10 minutes and got his mom back to bed. Patient went back to sleep. Mid morning when she woke up her husband helped her but we'll in her in dealing Rocker to her chair. She spent the day in the chair which is not abnormal for her. However in the late afternoon she was unable to get up. Her son came and tried to get her to stand up but she was unable to even help him to help her get up. At that time he thought she stable more confused and slightly lethargic. EMS was called the patient was transported to the emergency room       Prior Functioning     Home Living Family/patient expects to be discharged to:: Private residence Living Arrangements: Spouse/significant other Type of Home: House Home Layout: One level Home Equipment: Tub bench;Walker - 2 wheels Prior Function Level of Independence: Independent Communication Communication: No difficulties         Vision/Perception Vision - History Baseline Vision: Wears glasses all the time Patient Visual Report: No change from baseline   Cognition  Cognition Arousal/Alertness: Awake/alert Behavior During Therapy: WFL for tasks assessed/performed Overall Cognitive Status: Within Functional Limits for tasks assessed    Extremity/Trunk Assessment Upper Extremity Assessment Upper Extremity Assessment: Defer to OT evaluation Lower Extremity Assessment Lower Extremity Assessment: Generalized weakness Cervical / Trunk Assessment Cervical / Trunk Assessment: Kyphotic     Mobility Bed Mobility Bed Mobility: Supine to Sit Supine to Sit: 3: Mod assist Details for  Bed Mobility Assistance: Assist for trunk to upright and LEs off bed.  Transfers Transfers: Sit to Stand;Stand to Sit Sit to Stand: 4: Min assist;From bed Stand to Sit: 4: Min assist;To chair/3-in-1 Details for Transfer Assistance: Assist to rise, stabilize, control descent. Pt sat before safely positioned in front of chair despite cues from therapist.            End of Session OT - End of Session Activity Tolerance: Patient tolerated treatment well Patient left: in chair;with call bell/phone within reach;with family/visitor present Nurse Communication: Mobility status  GO     Alba Cory 07/10/2013, 11:54 AM

## 2013-07-10 NOTE — Evaluation (Signed)
Physical Therapy Evaluation Patient Details Name: Jeanette Hudson MRN: [id number] DOB: 1918/02/03 Today's Date: 07/10/2013 Time: 7829-5621 PT Time Calculation (min): 23 min  PT Assessment / Plan / Recommendation History of Present Illness  This is a delightful 80 year old lady who comes to [place] today with a one-day history of weakness. At baseline she and her husband live alone and their son lives 5 minutes away. She is able to get around with a walker and participate in her ADLs. This morning at 4 AM she got out of bed and her husband was helping her with the aid of her walker to the restroom. She became weak and "melted" to the floor. Her husband says she did not fall. It was more like she sat down. There was no head injury. The son came to the house within 10 minutes and got his mom back to bed. Patient went back to sleep. Mid morning when she woke up her husband helped her but we'll in her in dealing Rocker to her chair. She spent the day in the chair which is not abnormal for her. However in the late afternoon she was unable to get up. Her son came and tried to get her to stand up but she was unable to even help him to help her get up. At that time he thought she stable more confused and slightly lethargic. EMS was called the patient was transported to the emergency room  Clinical Impression  On eval, pt required Min-Mod assist for mobility. Very unsteady and at high risk for falls. Demonstrates general weakness and impaired gait and balance. Discussed d/c plan-feel pt would benefit from ST rehab stay at [facility] but pt/husband not agreeable to this. Unsure if husband can safely manage pt at this level however husband disagrees and  was not open to consider any of PT/OT's recommendations. Husband denying need for HHPT/HHOT as well.     PT Assessment  Patient needs continued PT services    Follow Up Recommendations  Home health PT;Supervision/Assistance - 24 hour-if pt/husband will agree.  (could benefit  from SNF but pt/husband not agreeable to this either)    Does the patient have the potential to tolerate intense rehabilitation      Barriers to Discharge        Equipment Recommendations  None recommended by PT    Recommendations for Other Services OT consult   Frequency Min 3X/week    Precautions / Restrictions Precautions Precautions: Fall Precaution Comments: high fall risk Restrictions Weight Bearing Restrictions: No   Pertinent Vitals/Pain No c/o pain      Mobility  Bed Mobility Bed Mobility: Supine to Sit Supine to Sit: 3: Mod assist Details for Bed Mobility Assistance: Assist for trunk to upright and LEs off bed.  Transfers Transfers: Sit to Stand;Stand to Sit Sit to Stand: 4: Min assist;From bed Stand to Sit: 4: Min assist;To chair/3-in-1 Details for Transfer Assistance: Assist to rise, stabilize, control descent. Pt sat before safely positioned in front of chair despite cues from therapist.  Ambulation/Gait Ambulation/Gait Assistance: 4: Min assist Ambulation Distance (Feet): 15 Feet Assistive device: Rolling walker Ambulation/Gait Assistance Details: Assist to stabilize and maneuver with walker. Pt very unsteady and at risk for falls. Bil LEs/feet externally rotated.  Gait Pattern: Step-through pattern;Wide base of support;Trunk flexed;Decreased stride length    Exercises     PT Diagnosis: Difficulty walking;Abnormality of gait;Generalized weakness  PT Problem List: Decreased strength;Decreased activity tolerance;Decreased balance;Decreased mobility;Decreased safety awareness;Decreased knowledge of use of DME PT Treatment  Interventions: Gait training;Functional mobility training;Therapeutic activities;Therapeutic exercise;Balance training;Patient/family education     PT Goals(Current goals can be found in the care plan section) Acute Rehab PT Goals Patient Stated Goal: home with husband PT Goal Formulation: With patient/family Time For Goal Achievement:  07/24/13 Potential to Achieve Goals: Good  Visit Information  Last PT Received On: 07/10/13 Assistance Needed: +1 Reason Eval/Treat Not Completed: Patient declined, no reason specified History of Present Illness: This is a delightful 80 year old lady who comes to us today with a one-day history of weakness. At baseline she and her husband live alone and their son lives 5 minutes away. She is able to get around with a walker and participate in her ADLs. This morning at 4 AM she got out of bed and her husband was helping her with the aid of her walker to the restroom. She became weak and "melted" to the floor. Her husband says she did not fall. It was more like she sat down. There was no head injury. The son came to the house within 10 minutes and got his mom back to bed. Patient went back to sleep. Mid morning when she woke up her husband helped her but we'll in her in dealing Rocker to her chair. She spent the day in the chair which is not abnormal for her. However in the late afternoon she was unable to get up. Her son came and tried to get her to stand up but she was unable to even help him to help her get up. At that time he thought she stable more confused and slightly lethargic. EMS was called the patient was transported to the emergency room       Prior Functioning  Home Living Family/patient expects to be discharged to:: Private residence Living Arrangements: Spouse/significant other Type of Home: House Home Layout: One level Home Equipment: Tub bench;Walker - 2 wheels Prior Function Level of Independence: Independent Communication Communication: No difficulties    Cognition  Cognition Arousal/Alertness: Awake/alert Behavior During Therapy: WFL for tasks assessed/performed Overall Cognitive Status: Within Functional Limits for tasks assessed    Extremity/Trunk Assessment Upper Extremity Assessment Upper Extremity Assessment: Defer to OT evaluation Lower Extremity  Assessment Lower Extremity Assessment: Generalized weakness Cervical / Trunk Assessment Cervical / Trunk Assessment: Kyphotic   Balance    End of Session PT - End of Session Equipment Utilized During Treatment: Gait belt Activity Tolerance: Patient tolerated treatment well Patient left: in chair;with call bell/phone within reach;with family/visitor present  GP Functional Assessment Tool Used: clinical judgement Functional Limitation: Mobility: Walking and moving around Mobility: Walking and Moving Around Current Status ([id number]([id number]): At least 20 percent but less than 40 percent impaired, limited or restricted Mobility: Walking and Moving Around Goal Status [phone]([id number]): At least 1 percent but less than 20 percent impaired, limited or restricted   Rebeca AlertJannie Stefania Goulart, MPT Pager: [phone]

## 2013-07-10 NOTE — Plan of Care (Signed)
Problem: Consults Goal: Skin Care Protocol Initiated - if Braden Score 18 or less If consults are not indicated, leave blank or document N/A Outcome: Progressing Repositioning patient every 2 hrs and using barrier cream to her bottom. Pt was incont of urine on admission to the floor.

## 2013-07-11 DIAGNOSIS — I369 Nonrheumatic tricuspid valve disorder, unspecified: Secondary | ICD-10-CM

## 2013-07-11 DIAGNOSIS — N63 Unspecified lump in unspecified breast: Secondary | ICD-10-CM

## 2013-07-11 DIAGNOSIS — J479 Bronchiectasis, uncomplicated: Secondary | ICD-10-CM

## 2013-07-11 MED ORDER — DEXTROSE 5 % IV SOLN
1.0000 g | Freq: Every day | INTRAVENOUS | Status: DC
  Administered 2013-07-11 – 2013-07-13 (×3): 1 g via INTRAVENOUS
  Filled 2013-07-11 (×4): qty 10

## 2013-07-11 NOTE — Progress Notes (Signed)
  Echocardiogram 2D Echocardiogram has been performed.  Arvil ChacoFoster, Kamilo Och 07/11/2013, 11:32 AM

## 2013-07-11 NOTE — Progress Notes (Signed)
[facility] PROGRESS NOTE  Jeanette Hudson ZOX:[id number] DOB: Nov 12, 1917 DOA: 07/09/2013 PCP: Janace Hoard, MD  Assessment/Plan  Sudden onset weakness with fall, patient not cooperative with exam.  Concern for possible stroke.  UA neg, CXR without acute disease, however now urine culture is growing GNR -  MRI pending -  Carotid duplex neg -  ECHO pending -  PT/OT evaluated and recommended SNF, however pt and husband declined both SNF and home health services.  She is very unsteady and at risk for falls.   -  Telemetry:  NSR with some sinus arrhythmia, continue telemetry  UTI -  UCx growing > 100K colonites -  Start ceftriaxone and await further culture data  CT chest concerning for enlarging right upper lateral breast lesion, patient refused to discuss today so will bring up again tomorrow -  Outpatient breast US and mammography  Bronchiectasis LLL  Atherosclerosis of the aorta  Acute sinusitis of the right sphenoid sinus, but patient denies symptoms -  Symptomatic care  CKD stage 3, minimize nephrotoxins and renally dose medications  Diet:  regular Access:  PIV IVF:  yes Proph:  lovenox  Code Status: full code Family Communication:  Patient alone Disposition Plan: pending MRI brain   Consultants:  None  Procedures:  CT head  CT chest  CXR  Antibiotics:  None   HPI/Subjective:  Patient denies focal weakness of arm or leg, but feels unsteady and weak.  She states she thinks she had a stroke.        Objective: Filed Vitals:   07/10/13 1100 07/10/13 1455 07/10/13 2058 07/11/13 0546  BP:  91/79 133/64 133/60  Pulse:  72 81 67  Temp: 97.5 F (36.4 C) 97.8 F (36.6 C) 97.9 F (36.6 C) 98 F (36.7 C)  TempSrc:  Oral Oral Oral  Resp:  18 18 16   Height:      Weight:      SpO2:  98% 98% 100%    Intake/Output Summary (Last 24 hours) at 07/11/13 1443 Last data filed at 07/11/13 0900  Gross per 24 hour  Intake   2275 ml  Output    525 ml  Net    1750 ml   Filed Weights   07/09/13 2332  Weight: 55.1 kg (121 lb 7.6 oz)    Exam:   General:  CF, No acute distress, but confused frequently during questions but complicated somewhat by language barrier due to her [place] accent and because her native language is [place].  HEENT:  NCAT, MMM  Cardiovascular:  RRR, nl S1, S2 no mrg, 2+ pulses, warm extremities  Respiratory:  CTAB, no increased WOB  Abdomen:   NABS, soft, NT/ND  MSK:   Normal tone and bulk, no LEE  Neuro:  CN II-XII grossly intact, strength 4/5 throughout and symmetric.  SITLT throughout.  Slow finger tap.    Data Reviewed: Basic Metabolic Panel:  Recent Labs Lab 07/09/13 1900 07/09/13 2359 07/10/13 0741  NA 135*  --  138  K 3.8  --  3.3*  CL 95*  --  102  CO2 27  --  25  GLUCOSE 109*  --  85  BUN 11  --  10  CREATININE 0.57 0.57 0.58  CALCIUM 8.7 8.2* 8.0*  MG  --  1.9  --   PHOS  --  2.9  --    Liver Function Tests:  Recent Labs Lab 07/09/13 1900  AST 42*  ALT 24  ALKPHOS 68  BILITOT 0.5  PROT 7.2  ALBUMIN 3.4*   No results found for this basename: LIPASE, AMYLASE,  in the last 168 hours No results found for this basename: AMMONIA,  in the last 168 hours CBC:  Recent Labs Lab 07/09/13 1900 07/09/13 2359 07/10/13 0741  WBC 7.1 5.7 4.1  NEUTROABS 5.5  --   --   HGB 14.7 12.9 12.1  HCT 44.0 39.8 36.4  MCV 97.8 97.3 97.6  PLT 196 168 151   Cardiac Enzymes: No results found for this basename: CKTOTAL, CKMB, CKMBINDEX, TROPONINI,  in the last 168 hours BNP (last 3 results) No results found for this basename: PROBNP,  in the last 8760 hours CBG: No results found for this basename: GLUCAP,  in the last 168 hours  Recent Results (from the past 240 hour(s))  URINE CULTURE     Status: None   Collection Time    07/10/13  3:49 PM      Result Value Range Status   Specimen Description URINE, CLEAN CATCH   Final   Special Requests Normal   Final   Culture  Setup Time     Final    Value: 07/10/2013 21:03     Performed at [facility] Count     Final   Value: >=100,000 COLONIES/ML     Performed at [facility]   Culture     Final   Value: GRAM NEGATIVE RODS     Performed at [facility]   Report Status PENDING   Incomplete     Studies: Dg Chest 2 View  07/09/2013   CLINICAL DATA:  Fall, nonsmoker  EXAM: CHEST  2 VIEW  COMPARISON:  08/15/2010  FINDINGS: Heart size is normal. Vascular pattern is normal. There is aortic calcification. No consolidation or effusion. In the retrocardiac left lower lobe there are 2 nodular opacities which are not seen on the prior study, both measuring about 7 mm.  IMPRESSION: No acute findings. However, cannot exclude pulmonary nodules in the left lower lobe. Consider CT thorax.   Electronically Signed   By: Esperanza Heir M.D.   On: 07/09/2013 19:04   Ct Head Wo Contrast  07/09/2013   CLINICAL DATA:  Fall. Abrasion to the left cheek. Weakness. Altered level of consciousness.  EXAM: CT HEAD WITHOUT CONTRAST  TECHNIQUE: Contiguous axial images were obtained from the base of the skull through the vertex without intravenous contrast.  COMPARISON:  03/12/2013.  FINDINGS: The brainstem, cerebellum, cerebral peduncles, thalamus, basal ganglia, basilar cisterns, and ventricular system appear within normal limits. Periventricular white matter and corona radiata hypodensities favor chronic ischemic microvascular white matter disease. No intracranial hemorrhage, mass lesion, or acute CVA. Chronic maxillary and ethmoid sinusitis noted with acute right sphenoid sinusitis.  IMPRESSION: 1. No acute intracranial findings. 2. Periventricular white matter and corona radiata hypodensities favor chronic ischemic microvascular white matter disease. 3. Acute sphenoid sinusitis with chronic maxillary and ethmoid sinusitis.   Electronically Signed   By: Herbie Baltimore M.D.   On: 07/09/2013 19:08   Ct Chest High Resolution  07/10/2013    CLINICAL DATA:  Pulmonary nodules.  EXAM: CHEST CT WITHOUT CONTRAST  TECHNIQUE: Multidetector CT imaging of the chest was performed following the standard protocol without intravenous contrast. High resolution imaging of the lungs, as well as inspiratory and expiratory imaging, was performed.  COMPARISON:  08/15/2010  FINDINGS: The chest wall demonstrates an enlarging nodular mass in the right lateral breast. Recommend correlation with physical exam,  mammography and ultrasound. No axillary adenopathy or supraclavicular adenopathy. The bony thorax is intact. Severe osteoporosis and severe degenerative disease involving the spine. Probable remote healed sternal fracture. Remote healed rib fractures are also noted.  The heart is normal in size. No pericardial effusion. There is tortuosity, ectasia and advanced atherosclerotic calcification involving the aorta and branch vessels. There are borderline enlarged mediastinal lymph nodes most notable in the aortic 0 pulmonary window and sub carina. The esophagus is grossly normal.  Examination of the lung parenchyma demonstrates no worrisome pulmonary nodules or masses. There is progressive peribronchial disease in the left lower lobe and bronchiectasis. No pleural effusion.  The upper abdomen is unremarkable. Severe atherosclerotic disease involving the aorta is noted.  IMPRESSION: Enlarging right upper lateral breast lesion. Recommend correlation with physical exam, ultrasound and mammography.  No worrisome pulmonary masses or nodules.  Worsening left lower lobe peribronchial disease and bronchiectasis.  Advanced atherosclerotic disease involving the aorta and branch vessels.  Borderline enlarged mediastinal lymph nodes.   Electronically Signed   By: Loralie ChampagneMark  Gallerani M.D.   On: 07/10/2013 01:27    Scheduled Meds: . antiseptic oral rinse  15 mL Mouth Rinse BID  . docusate sodium  100 mg Oral BID  . enoxaparin (LOVENOX) injection  40 mg Subcutaneous QHS  . influenza  vac split quadrivalent PF  0.5 mL Intramuscular Tomorrow-1000  . lactose free nutrition  237 mL Oral TID WC  . pneumococcal 23 valent vaccine  0.5 mL Intramuscular Tomorrow-1000  . sodium chloride  3 mL Intravenous Q12H   Continuous Infusions: . 0.9 % NaCl with KCl 20 mEq / L 75 mL/hr at 07/11/13 1211    Active Problems:   Weakness   Fall   Protein calorie malnutrition   Mild dehydration   Pulmonary nodules   Breast mass, right   Bronchiectasis without acute exacerbation    Time spent: 30 min    Kenzlie Disch, [facility]ACK[facility]  [facility] Pager [phone]. If 7PM-7AM, please contact night-coverage at [email or web address], password [email or web address] 07/11/2013, 2:43 PM  LOS: 2 days

## 2013-07-12 ENCOUNTER — Inpatient Hospital Stay (HOSPITAL_COMMUNITY): Payer: Medicare Other

## 2013-07-12 DIAGNOSIS — I2510 Atherosclerotic heart disease of native coronary artery without angina pectoris: Secondary | ICD-10-CM

## 2013-07-12 LAB — URINE CULTURE
Colony Count: >=100000
Special Requests: NORMAL

## 2013-07-12 MED ORDER — CARVEDILOL 3.125 MG PO TABS
3.1250 mg | ORAL_TABLET | Freq: Two times a day (BID) | ORAL | Status: DC
  Administered 2013-07-13 – 2013-07-14 (×3): 3.125 mg via ORAL
  Filled 2013-07-12 (×5): qty 1

## 2013-07-12 MED ORDER — MEGESTROL ACETATE 400 MG/10ML PO SUSP
400.0000 mg | Freq: Every day | ORAL | Status: DC
  Administered 2013-07-12 – 2013-07-14 (×3): 400 mg via ORAL
  Filled 2013-07-12 (×4): qty 10

## 2013-07-12 NOTE — Progress Notes (Signed)
[facility] PROGRESS NOTE  Joanmarie Tsang JXB:[id number] DOB: June 08, 1918 DOA: 07/09/2013 PCP: Janace Hoard, MD  Assessment/Plan  Sudden onset weakness with fall superimposed on more chronic ambulatory dysfunction and confusion.  Acute symptoms may have been due to dehydration and UTI.   -  MRI demonstrated prominent ventricles concerning for NPH -  Spoke with neurology, plan for LP in AM (discussed with patient and family and okay with plan) -  Lovenox changed to SCDs -  Carotid duplex neg -  PT/OT evaluated and recommended SNF, patient and husband now amenable  CAD s/p MI, ECHO demonstrated inferolateral hypokinesis with 55% EF, grade 1 DD, trace MR and trace AR.  ECG remains unchanged during this admission and patient chest pain free so likely MI occurred distantly.  Patient likely not a good candidate for cath because if she had intervention she would need antiplatelet therapy and given ambulatory dysfunction, she would be at risk of bleeding complications on asa + plavix.  For same reasons, not a good candidate for CABG.  Given her age and dementia and failure to thrive, medical management probably best at this time.   -  Will start asa 81mg  daily post-LP.   -  As statins can decrease muscle mass and increase confusion and because patient's life expectancy is < 10 years, will defer initiation of statin at this time. -  Will start low dose beta blocker.   -  Telemetry:  Sinus arrhythmia but rate controlled.  Starting asa and would not recommend a/c in this patient.  D/c telemetry  E. Coli UTI, continue ceftriaxone day 2 and await further culture data  CT chest concerning for enlarging right upper lateral breast lesion, discussed with patient and her husband at length today.  Mass has been present for several years and has been stable per her report. -  Outpatient breast US and mammography  Bronchiectasis LLL  Atherosclerosis of the aorta  Acute sinusitis of the right sphenoid  sinus, patient asymptomatic  CKD stage 3, minimize nephrotoxins and renally dose medications  Moderate protein calorie malnutrition -  Nutrition consultation -  Supplements  -  Liberalize diet  -  Start megace  Diet:  Regular with supplements Access:  PIV IVF:  yes Proph:  lovenox  Code Status: full code Family Communication:  Patient alone Disposition Plan:  LP in AM, then probable d/c to SNF   Consultants:  None  Procedures:  CT head  CT chest  CXR  Antibiotics:  Ceftriaxone 1/4 >>  HPI/Subjective:  Patient denies focal weakness of arm or leg.          Objective: Filed Vitals:   07/11/13 1517 07/11/13 2044 07/12/13 0545 07/12/13 1417  BP: 144/69 153/70 125/69 133/88  Pulse: 86 80 68 70  Temp: 97.8 F (36.6 C) 98 F (36.7 C) 98 F (36.7 C) 98 F (36.7 C)  TempSrc: Oral Oral Oral Oral  Resp: 18 18 18 18   Height:      Weight:      SpO2: 99% 96% 100% 97%    Intake/Output Summary (Last 24 hours) at 07/12/13 1851 Last data filed at 07/12/13 1500  Gross per 24 hour  Intake 1287.67 ml  Output    950 ml  Net 337.67 ml   Filed Weights   07/09/13 2332  Weight: 55.1 kg (121 lb 7.6 oz)    Exam:   General:  CF, No acute distress, but confused frequently during questions but complicated somewhat by language barrier due to  her [place] accent and because her native language is [place].  HEENT:  NCAT, MMM  Cardiovascular:  RRR, nl S1, S2 no mrg, 2+ pulses, warm extremities  Respiratory:  CTAB, no increased WOB  Abdomen:   NABS, soft, NT/ND  MSK:   Decreased tone and bulk, no LEE  Neuro:  CN II-XII grossly intact, strength 5-/5 throughout and symmetric.  SITLT throughout.   Data Reviewed: Basic Metabolic Panel:  Recent Labs Lab 07/09/13 1900 07/09/13 2359 07/10/13 0741  NA 135*  --  138  K 3.8  --  3.3*  CL 95*  --  102  CO2 27  --  25  GLUCOSE 109*  --  85  BUN 11  --  10  CREATININE 0.57 0.57 0.58  CALCIUM 8.7 8.2* 8.0*  MG  --  1.9   --   PHOS  --  2.9  --    Liver Function Tests:  Recent Labs Lab 07/09/13 1900  AST 42*  ALT 24  ALKPHOS 68  BILITOT 0.5  PROT 7.2  ALBUMIN 3.4*   No results found for this basename: LIPASE, AMYLASE,  in the last 168 hours No results found for this basename: AMMONIA,  in the last 168 hours CBC:  Recent Labs Lab 07/09/13 1900 07/09/13 2359 07/10/13 0741  WBC 7.1 5.7 4.1  NEUTROABS 5.5  --   --   HGB 14.7 12.9 12.1  HCT 44.0 39.8 36.4  MCV 97.8 97.3 97.6  PLT 196 168 151   Cardiac Enzymes: No results found for this basename: CKTOTAL, CKMB, CKMBINDEX, TROPONINI,  in the last 168 hours BNP (last 3 results) No results found for this basename: PROBNP,  in the last 8760 hours CBG: No results found for this basename: GLUCAP,  in the last 168 hours  Recent Results (from the past 240 hour(s))  URINE CULTURE     Status: None   Collection Time    07/10/13  3:49 PM      Result Value Range Status   Specimen Description URINE, CLEAN CATCH   Final   Special Requests Normal   Final   Culture  Setup Time     Final   Value: 07/10/2013 21:03     [facility]   Colony Count     Final   Value: >=100,000 COLONIES/ML     [facility]   Culture     Final   Value: ESCHERICHIA COLI     [facility]   Report Status 07/12/2013 FINAL   Final   Organism ID, Bacteria ESCHERICHIA COLI   Final     Studies: Mr Brain Wo Contrast  07/12/2013   CLINICAL DATA:  80 year old female with altered level of consciousness and generalized weakness. Confusion. Initial encounter.  EXAM: MRI HEAD WITHOUT CONTRAST  TECHNIQUE: Multiplanar, multiecho pulse sequences of the brain and surrounding structures were obtained without intravenous contrast.  COMPARISON:  Head CTs 07/09/2013 and earlier.  FINDINGS: No restricted diffusion or evidence of acute infarction. Major intracranial vascular flow voids are preserved.  Cavum septum pellucidum and  ventricular prominence. Mildly prominent temporal horns. Patchy in confluent cerebral white matter T2 and FLAIR hyperintensity, but not suggestive of transependymal edema.  No restricted diffusion to suggest acute infarction. No midline shift, mass effect, evidence of mass lesion, extra-axial collection or acute intracranial hemorrhage. Cervicomedullary junction and pituitary are within normal limits. Negative for age visualized cervical spine. Normal bone marrow signal. No  cortical encephalomalacia. Mild for age T2 heterogeneity in the deep gray matter nuclei. Mild to moderate for age T2 heterogeneity in the brainstem. Cerebellum within normal limits for age.  Mastoids are clear. Paranasal sinus mucosal thickening with fluid level or polypoid opacity in the right sphenoid sinus. Visualized orbit soft tissues are within normal limits. Normal bone marrow signal. Visualized scalp soft tissues are within normal limits.  IMPRESSION: 1. No evidence of acute infarct. 2. Ventricular prominence with superimposed cavum septum callosum. In the appropriate clinical setting, normal pressure hydrocephalus could not be excluded. 3. Moderate for age nonspecific signal changes in the brain, most commonly due to chronic small vessel disease. 4. Paranasal sinus inflammation.   Electronically Signed   By: Augusto Gamble M.D.   On: 07/12/2013 10:29    Scheduled Meds: . antiseptic oral rinse  15 mL Mouth Rinse BID  . cefTRIAXone (ROCEPHIN)  IV  1 g Intravenous Q1400  . docusate sodium  100 mg Oral BID  . influenza vac split quadrivalent PF  0.5 mL Intramuscular Tomorrow-1000  . lactose free nutrition  237 mL Oral TID WC  . megestrol  400 mg Oral Daily  . pneumococcal 23 valent vaccine  0.5 mL Intramuscular Tomorrow-1000  . sodium chloride  3 mL Intravenous Q12H   Continuous Infusions: . 0.9 % NaCl with KCl 20 mEq / L 20 mL/hr (07/12/13 1020)    Active Problems:   Weakness   Fall   Protein calorie malnutrition   Mild  dehydration   Pulmonary nodules   Breast mass, right   Bronchiectasis without acute exacerbation    Time spent: 30 min    Trevyn Lumpkin, [facility]  [facility] Pager [phone]. If 7PM-7AM, please contact night-coverage at [email or web address], password [facility] 07/12/2013, 6:51 PM  LOS: 3 days

## 2013-07-12 NOTE — Progress Notes (Signed)
PT Cancellation Note  Patient Details Name: Jeanette Hudson MRN: [id number] DOB: 11-26-1917   Cancelled Treatment:    Reason Eval/Treat Not Completed: Patient declined, no reason specified Pt in room with family sitting with her. Was unable to encourage pt to work with me today. Pt continued to state" I am not going to make any decisions on anything, my husband has to be here and Iwant him in on the decisions". Very difficult to get her to understand that I was from therapy and the goal was just to continue to assess her mobility and get her stronger. She politely asked for me to come when her husband was here, so we will return int he morning.   Family also stated they are awaiting MD to visit today and anxiously awaiting MRI results.   Marella BileBRITT, Hennessy Bartel 07/12/2013, 3:38 PM Marella BileSharron Dahiana Kulak, PT Pager: [phone] 07/12/2013

## 2013-07-13 ENCOUNTER — Encounter (HOSPITAL_COMMUNITY): Payer: Self-pay | Admitting: Neurology

## 2013-07-13 DIAGNOSIS — B9620 Unspecified Escherichia coli [E. coli] as the cause of diseases classified elsewhere: Secondary | ICD-10-CM

## 2013-07-13 DIAGNOSIS — G912 (Idiopathic) normal pressure hydrocephalus: Secondary | ICD-10-CM

## 2013-07-13 DIAGNOSIS — I2510 Atherosclerotic heart disease of native coronary artery without angina pectoris: Secondary | ICD-10-CM

## 2013-07-13 DIAGNOSIS — N390 Urinary tract infection, site not specified: Principal | ICD-10-CM

## 2013-07-13 DIAGNOSIS — A498 Other bacterial infections of unspecified site: Secondary | ICD-10-CM

## 2013-07-13 LAB — BASIC METABOLIC PANEL
BUN: 7 mg/dL (ref 6–23)
CHLORIDE: 103 meq/L (ref 96–112)
CO2: 24 meq/L (ref 19–32)
Calcium: 8.4 mg/dL (ref 8.4–10.5)
Creatinine, Ser: 0.56 mg/dL (ref 0.50–1.10)
GFR calc Af Amer: >90 mL/min (ref >90)
GFR, EST NON AFRICAN AMERICAN: 78 mL/min — AB (ref >90)
GLUCOSE: 90 mg/dL (ref 70–99)
POTASSIUM: 3.4 meq/L — AB (ref 3.7–5.3)
SODIUM: 140 meq/L (ref 137–147)

## 2013-07-13 LAB — CBC
HCT: 38.9 % (ref 36.0–46.0)
HEMOGLOBIN: 13.2 g/dL (ref 12.0–15.0)
MCH: 32.3 pg (ref 26.0–34.0)
MCHC: 33.9 g/dL (ref 30.0–36.0)
MCV: 95.1 fL (ref 78.0–100.0)
Platelets: 223 10*3/uL (ref 150–400)
RBC: 4.09 MIL/uL (ref 3.87–5.11)
RDW: 12.7 % (ref 11.5–15.5)
WBC: 6 10*3/uL (ref 4.0–10.5)

## 2013-07-13 LAB — PROTIME-INR
INR: 1.05 (ref 0.00–1.49)
PROTHROMBIN TIME: 13.5 s (ref 11.6–15.2)

## 2013-07-13 MED ORDER — ASPIRIN EC 81 MG PO TBEC: 81.0000 mg | DELAYED_RELEASE_TABLET | Freq: Every day | ORAL | Status: AC

## 2013-07-13 MED ORDER — MEGESTROL ACETATE 400 MG/10ML PO SUSP: 400.0000 mg | Freq: Every day | ORAL | Status: AC

## 2013-07-13 MED ORDER — CARVEDILOL 3.125 MG PO TABS: 3.1250 mg | ORAL_TABLET | Freq: Two times a day (BID) | ORAL | Status: AC

## 2013-07-13 NOTE — Consult Note (Signed)
NEURO HOSPITALIST CONSULT NOTE    Reason for Consult: ? NPH  HPI:                                                                                                                                          Jeanette Hudson is an 80 y.o. female who presents to the hospital with LE weakness.  On 07/09/12 patient was noted to get out of her bed and not able to hold herself up due to LE weakness and slowly eased herself to the floor. There was no fall or loss of consciousness. Patient was assisted back to bed by son.  She went to sleep and again awoke later in afternoon but was unable to get up due to significant LE weakness. She was also noted to be more confused and lethargic. In speak ing with the husband, this has been ongoing for about 2 years.  She will suddenly become weak in bilateral legs which seems to resolve if she lays down.  The episode may last for minutes up to hours.  There is no specific cause that he can attribute to her becoming weak.  MRI brain was obtained "showing Patchy in confluent cerebral white matter T2 and FLAIR hyperintensity, but not suggestive of transependymal edema. Ventricular prominence with superimposed cavum septum callosum. In the appropriate clinical setting, normal pressure hydrocephalus could not be excluded." Urine culture showed positive Ecoli.     Past Medical History  Diagnosis Date  . Arthritis     Past Surgical History  Procedure Laterality Date  . Cholecystectomy    . Abdominal hysterectomy    . Ectopic pregnancy surgery      Family History  Problem Relation Age of Onset  . Hyperlipidemia Mother   . Hypertension Mother   . Hypertension Father     Social History:  reports that she has never smoked. She has never used smokeless tobacco. She reports that she drinks about 0.6 ounces of alcohol per week. She reports that she does not use illicit drugs.  No Known Allergies  MEDICATIONS:                                                                                                                      Scheduled: . antiseptic oral rinse  15 mL Mouth Rinse BID  . carvedilol  3.125 mg Oral BID WC  . cefTRIAXone (ROCEPHIN)  IV  1 g Intravenous Q1400  . docusate sodium  100 mg Oral BID  . influenza vac split quadrivalent PF  0.5 mL Intramuscular Tomorrow-1000  . lactose free nutrition  237 mL Oral TID WC  . megestrol  400 mg Oral Daily  . pneumococcal 23 valent vaccine  0.5 mL Intramuscular Tomorrow-1000  . sodium chloride  3 mL Intravenous Q12H   Continuous: . 0.9 % NaCl with KCl 20 mEq / L 20 mL/hr at 07/12/13 2300   PRN:acetaminophen, acetaminophen   ROS:                                                                                                                                       History obtained from chart and husband  General ROS: negative for - chills, fatigue, fever, night sweats, weight gain or weight loss Psychological ROS: negative for - behavioral disorder, hallucinations, memory difficulties, mood swings or suicidal ideation Ophthalmic ROS: negative for - blurry vision, double vision, eye pain or loss of vision ENT ROS: negative for - epistaxis, nasal discharge, oral lesions, sore throat, tinnitus or vertigo Allergy and Immunology ROS: negative for - hives or itchy/watery eyes Hematological and Lymphatic ROS: negative for - bleeding problems, bruising or swollen lymph nodes Endocrine ROS: negative for - galactorrhea, hair pattern changes, polydipsia/polyuria or temperature intolerance Respiratory ROS: negative for - cough, hemoptysis, shortness of breath or wheezing Cardiovascular ROS: negative for - chest pain, dyspnea on exertion, edema or irregular heartbeat Gastrointestinal ROS: negative for - abdominal pain, diarrhea, hematemesis, nausea/vomiting or stool incontinence Genito-Urinary ROS: negative for - dysuria, hematuria, incontinence or urinary frequency/urgency Musculoskeletal ROS: negative  for - joint swelling or muscular weakness Neurological ROS: as noted in HPI Dermatological ROS: negative for rash and skin lesion changes   Blood pressure 142/66, pulse 71, temperature 98.1 F (36.7 C), temperature source Oral, resp. rate 18, height 5' 2" (1.575 m), weight 55.1 kg (121 lb 7.6 oz), SpO2 99.00%.   Neurologic Examination:                                                                                                      Mental Status: Alert, oriented, thought content appropriate.  Speech fluent without evidence of aphasia.  Able to follow 3 step commands without difficulty. Cranial Nerves: II: Discs flat bilaterally; Visual fields grossly normal, pupils equal, round, reactive to   light and accommodation III,IV, VI: ptosis not present, extra-ocular motions intact bilaterally V,VII: smile symmetric, facial light touch sensation normal bilaterally VIII: hearing decreased bilaterally IX,X: gag reflex present XI: bilateral shoulder shrug XII: midline tongue extension without atrophy or fasciculations  Motor: Right : Upper extremity   5/5    Left:     Upper extremity   5/5  Lower extremity   5/5     Lower extremity   5/5 Tone and bulk:normal tone throughout; no atrophy noted Sensory: Pinprick and light touch intact throughout, bilaterally Deep Tendon Reflexes:  1+ bilateral UE ad no KJ or AJ Plantars: Right: downgoing   Left: downgoing Cerebellar: normal finger-to-nose,  normal heel-to-shin test Gait: wide based and not steady.  Uses a walker at baseline.  CV: pulses palpable throughout    No components found with this basename: cbc,  bmp,  coags,  chol,  tri,  ldl,  hga1c    Results for orders placed during the hospital encounter of 07/09/13 (from the past 48 hour(s))  BASIC METABOLIC PANEL     Status: Abnormal   Collection Time    07/13/13  5:16 AM      Result Value Range   Sodium 140  137 - 147 mEq/L   Potassium 3.4 (*) 3.7 - 5.3 mEq/L   Chloride 103  96 - 112  mEq/L   CO2 24  19 - 32 mEq/L   Glucose, Bld 90  70 - 99 mg/dL   BUN 7  6 - 23 mg/dL   Creatinine, Ser 0.56  0.50 - 1.10 mg/dL   Calcium 8.4  8.4 - 10.5 mg/dL   GFR calc non Af Amer 78 (*) >90 mL/min   GFR calc Af Amer >90  >90 mL/min   Comment: (NOTE)     The eGFR has been calculated using the CKD EPI equation.     This calculation has not been validated in all clinical situations.     eGFR's persistently <90 mL/min signify possible Chronic Kidney     Disease.  CBC     Status: None   Collection Time    07/13/13  5:16 AM      Result Value Range   WBC 6.0  4.0 - 10.5 K/uL   RBC 4.09  3.87 - 5.11 MIL/uL   Hemoglobin 13.2  12.0 - 15.0 g/dL   HCT 38.9  36.0 - 46.0 %   MCV 95.1  78.0 - 100.0 fL   MCH 32.3  26.0 - 34.0 pg   MCHC 33.9  30.0 - 36.0 g/dL   RDW 12.7  11.5 - 15.5 %   Platelets 223  150 - 400 K/uL  PROTIME-INR     Status: None   Collection Time    07/13/13  5:16 AM      Result Value Range   Prothrombin Time 13.5  11.6 - 15.2 seconds   INR 1.05  0.00 - 1.49    Mr Brain Wo Contrast  07/12/2013   CLINICAL DATA:  89-year-old female with altered level of consciousness and generalized weakness. Confusion. Initial encounter.  EXAM: MRI HEAD WITHOUT CONTRAST  TECHNIQUE: Multiplanar, multiecho pulse sequences of the brain and surrounding structures were obtained without intravenous contrast.  COMPARISON:  Head CTs 07/09/2013 and earlier.  FINDINGS: No restricted diffusion or evidence of acute infarction. Major intracranial vascular flow voids are preserved.  Cavum septum pellucidum and ventricular prominence. Mildly prominent temporal horns. Patchy in confluent cerebral white matter T2 and FLAIR hyperintensity,   but not suggestive of transependymal edema.  No restricted diffusion to suggest acute infarction. No midline shift, mass effect, evidence of mass lesion, extra-axial collection or acute intracranial hemorrhage. Cervicomedullary junction and pituitary are within normal limits.  Negative for age visualized cervical spine. Normal bone marrow signal. No cortical encephalomalacia. Mild for age T2 heterogeneity in the deep gray matter nuclei. Mild to moderate for age T2 heterogeneity in the brainstem. Cerebellum within normal limits for age.  Mastoids are clear. Paranasal sinus mucosal thickening with fluid level or polypoid opacity in the right sphenoid sinus. Visualized orbit soft tissues are within normal limits. Normal bone marrow signal. Visualized scalp soft tissues are within normal limits.  IMPRESSION: 1. No evidence of acute infarct. 2. Ventricular prominence with superimposed cavum septum callosum. In the appropriate clinical setting, normal pressure hydrocephalus could not be excluded. 3. Moderate for age nonspecific signal changes in the brain, most commonly due to chronic small vessel disease. 4. Paranasal sinus inflammation.   Electronically Signed   By: Lars Pinks M.D.   On: 07/12/2013 10:29   2 D echo: Impressions:  - Mild LVH with LVEF 55%, basal inferolateral hypokinesis, grade 1 diastolic dysfunction. Mild left atrial enlargement. MAC with thickened mitral leaflet and trivial mitral regurgitation. Moderate aortic sclerosis without stenosis, trivial aortic regurgitation. Mild trivuspid regurgitation with PASP 36 mmHg. No PFO or ASD. Prominent anterior epicardial fat pad.  Carotid Doppler: Summary: Bilateral: intimal wall thickening CCA. Mild to moderate mixed plaque origin and proximal ICA and ECA. 1-39% ICA stenosis. Vertebral artery flow is antegrade.   Assessment and plan per attending neurologist  Etta Quill PA-C [facility] [phone]  07/13/2013, 9:25 AM   Assessment/Plan: 80 YO female with 2 years history of unsteady gait and intermittent bilateral LE weakness in the setting of E. Coli UTI. 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  A long discission with husband and patient was had.  It was explained that a LP would  only be diagnostic of NPH and if this was positive she would need a shunt.  Both patient and husband do not wish to have a shunt placed. It was then explained that a LP would not be beneficial at this time but would recommend PT.    Recommend: 1) PT while both in hospital and at time of discharge.    Patient seen and examined together with physician assistant and I concur with the assessment and plan.  Dorian Pod, MD

## 2013-07-13 NOTE — Discharge Summary (Addendum)
Physician Discharge Summary  Jeanette Hudson RUE:[id number] DOB: 1918-01-24 DOA: 07/09/2013  PCP: Janace HoardHOPPER,DAVID, MD  Admit date: 07/09/2013 Discharge date: 07/13/2013  Recommendations for Outpatient Follow-up:  1. Follow up with Neurology in 1 month 2. Follow up with primary care doctor in 1 week for referral for breast US and mammography.  Management of blood pressure, CAD.   3. To SNF for PT/OT  Discharge Diagnoses:  Active Problems:   Weakness   Fall   Protein calorie malnutrition   Mild dehydration   Pulmonary nodules   Breast mass, right   Bronchiectasis without acute exacerbation   CAD (coronary artery disease), native coronary artery   E. coli UTI   Normal pressure hydrocephalus   Discharge Condition: stable, improved  Diet recommendation: regular with supplements   Wt Readings from Last 3 Encounters:  07/13/13 55.1 kg (121 lb 7.6 oz)  08/24/12 59.603 kg (131 lb 6.4 oz)    History of present illness:  Jeanette Hudson is a 80 y.o. female  This is a delightful 80 year old lady who comes to us today with a one-day history of weakness. At baseline she and her husband live alone and their son lives 5 minutes away. She is able to get around with a walker and participate in her ADLs. This morning at 4 AM she got out of bed and her husband was helping her with the aid of her walker to the restroom. She became weak and "melted" to the floor. Her husband says she did not fall. It was more like she sat down. There was no head injury. The son came to the house within 10 minutes and got his mom back to bed. Patient went back to sleep. Mid morning when she woke up her husband helped her but we'll in her in dealing Rocker to her chair. She spent the day in the chair which is not abnormal for her. However in the late afternoon she was unable to get up. Her son came and tried to get her to stand up but she was unable to even help him to help her get up. At that time he thought she stable more  confused and slightly lethargic. EMS was called the patient was transported to the emergency room.  Hospital Course:  Sudden onset weakness with fall superimposed on more chronic ambulatory dysfunction and confusion. Acute symptoms may have been due to dehydration and UTI.  MRI demonstrated prominent ventricles concerning for NPH, but no evidence of stroke. Neurology was consult and recommended against doing diagnostic LP because she is not a good candidate for shunt placement. Her carotid pulses negative. Her echocardiogram demonstrated evidence of previous MI. Her ejection fraction was 55%. She received IV fluids and antibiotics, and her mentation and strength gradually improved. She was repeatedly evaluated by physical therapy who recommended that she go to skilled nursing facility for [facility]-term rehabilitation.  CAD s/p MI, ECHO demonstrated inferolateral hypokinesis with 55% EF, grade 1 DD, trace MR and trace AR. ECG remains unchanged during this admission and patient chest pain free so likely MI occurred distantly. Patient likely not a good candidate for cath because if she had intervention she would need antiplatelet therapy and given ambulatory dysfunction, she would be at risk of bleeding complications on asa + plavix. For same reasons, not a good candidate for CABG. Given her age and dementia and failure to thrive, medical management probably best at this time.  She was started on aspirin 81 mg daily and low dose beta blocker. Her  blood pressure remains mildly elevated. She should have a blood pressure check in approximately one week by her primary care doctor in further medication adjustment as needed. Because of statins can decrease muscle mass and increased confusion and because this patient's life expectancy is less than 10 years, I deferred initiation of statin medication at this time.  Her EKG remained unchanged and her telemetry demonstrated sinus arrhythmia without atrial fibrillation, rate  within normal limits.    E. Coli UTI, she completed 3 days of ceftriaxone.  CT chest concerning for enlarging right upper lateral breast lesion, discussed with patient and her husband at length today. Mass has been present for several years and has been stable per her report.  Recommend outpatient breast ultrasound and mammography. This can be ordered by her primary care doctor.  Bronchiectasis LLL and atherosclerosis of the aorta   Acute sinusitis of the right sphenoid sinus, patient asymptomatic  CKD stage 3, minimize nephrotoxins and renally dose medications  Moderate protein calorie malnutrition.  Recommended to continue supplements and a liberalized diet. Recommend that she drink at least 1.2 L of fluid daily. She was started on Megace.    Consultants:  Neurology Procedures:  CT head  CT chest  CXR Antibiotics:  Ceftriaxone 1/4 >> 1/6   Discharge Exam: Filed Vitals:   07/13/13 1511  BP: 130/61  Pulse: 81  Temp: 98.1 F (36.7 C)  Resp: 20   Filed Vitals:   07/12/13 1417 07/12/13 2107 07/13/13 0604 07/13/13 1511  BP: 133/88 122/62 142/66 130/61  Pulse: 70 78 71 81  Temp: 98 F (36.7 C) 97.3 F (36.3 C) 98.1 F (36.7 C) 98.1 F (36.7 C)  TempSrc: Oral Oral Oral Oral  Resp: 18 18 18 20   Height:      Weight:   55.1 kg (121 lb 7.6 oz)   SpO2: 97% 100% 99% 99%    General: CF, No acute distress, less confused today  HEENT: NCAT, MMM  Cardiovascular: RRR, nl S1, S2 no mrg, 2+ pulses, warm extremities  Respiratory: CTAB, no increased WOB  Abdomen: NABS, soft, NT/ND  MSK: Decreased tone and bulk, no LEE  Neuro: CN II-XII grossly intact, strength 5-/5 throughout and symmetric. SITLT throughout.    Discharge Instructions      Discharge Orders   Future Orders Complete By Expires   Call MD for:  difficulty breathing, headache or visual disturbances  As directed    Call MD for:  extreme fatigue  As directed    Call MD for:  hives  As directed    Call MD for:   persistant dizziness or light-headedness  As directed    Call MD for:  persistant nausea and vomiting  As directed    Call MD for:  severe uncontrolled pain  As directed    Call MD for:  temperature >100.4  As directed    Diet general  As directed    Increase activity slowly  As directed        Medication List         acetaminophen 500 MG tablet  Commonly known as:  TYLENOL  Take 500 mg by mouth every 6 (six) hours as needed for pain.     aspirin EC 81 MG tablet  Take 1 tablet (81 mg total) by mouth daily.     carvedilol 3.125 MG tablet  Commonly known as:  COREG  Take 1 tablet (3.125 mg total) by mouth 2 (two) times daily with a meal.  megestrol 400 MG/10ML suspension  Commonly known as:  MEGACE  Take 10 mLs (400 mg total) by mouth daily.     POTASSIUM PO  Take 1 tablet by mouth daily. Over the counter.       Follow-up Information   Follow up with HOPPER,DAVID, MD. Schedule an appointment as soon as possible for a visit in 1 week.   Specialty:  Family Medicine   Contact information:   [address] [place] [address] [phone]        The results of significant diagnostics from this hospitalization (including imaging, microbiology, ancillary and laboratory) are listed below for reference.    Significant Diagnostic Studies: Dg Chest 2 View  07/09/2013   CLINICAL DATA:  Fall, nonsmoker  EXAM: CHEST  2 VIEW  COMPARISON:  08/15/2010  FINDINGS: Heart size is normal. Vascular pattern is normal. There is aortic calcification. No consolidation or effusion. In the retrocardiac left lower lobe there are 2 nodular opacities which are not seen on the prior study, both measuring about 7 mm.  IMPRESSION: No acute findings. However, cannot exclude pulmonary nodules in the left lower lobe. Consider CT thorax.   Electronically Signed   By: Esperanza Heir M.D.   On: 07/09/2013 19:04   Ct Head Wo Contrast  07/09/2013   CLINICAL DATA:  Fall. Abrasion to the left cheek.  Weakness. Altered level of consciousness.  EXAM: CT HEAD WITHOUT CONTRAST  TECHNIQUE: Contiguous axial images were obtained from the base of the skull through the vertex without intravenous contrast.  COMPARISON:  03/12/2013.  FINDINGS: The brainstem, cerebellum, cerebral peduncles, thalamus, basal ganglia, basilar cisterns, and ventricular system appear within normal limits. Periventricular white matter and corona radiata hypodensities favor chronic ischemic microvascular white matter disease. No intracranial hemorrhage, mass lesion, or acute CVA. Chronic maxillary and ethmoid sinusitis noted with acute right sphenoid sinusitis.  IMPRESSION: 1. No acute intracranial findings. 2. Periventricular white matter and corona radiata hypodensities favor chronic ischemic microvascular white matter disease. 3. Acute sphenoid sinusitis with chronic maxillary and ethmoid sinusitis.   Electronically Signed   By: Herbie Baltimore M.D.   On: 07/09/2013 19:08   Mr Brain Wo Contrast  07/12/2013   CLINICAL DATA:  80 year old female with altered level of consciousness and generalized weakness. Confusion. Initial encounter.  EXAM: MRI HEAD WITHOUT CONTRAST  TECHNIQUE: Multiplanar, multiecho pulse sequences of the brain and surrounding structures were obtained without intravenous contrast.  COMPARISON:  Head CTs 07/09/2013 and earlier.  FINDINGS: No restricted diffusion or evidence of acute infarction. Major intracranial vascular flow voids are preserved.  Cavum septum pellucidum and ventricular prominence. Mildly prominent temporal horns. Patchy in confluent cerebral white matter T2 and FLAIR hyperintensity, but not suggestive of transependymal edema.  No restricted diffusion to suggest acute infarction. No midline shift, mass effect, evidence of mass lesion, extra-axial collection or acute intracranial hemorrhage. Cervicomedullary junction and pituitary are within normal limits. Negative for age visualized cervical spine. Normal  bone marrow signal. No cortical encephalomalacia. Mild for age T2 heterogeneity in the deep gray matter nuclei. Mild to moderate for age T2 heterogeneity in the brainstem. Cerebellum within normal limits for age.  Mastoids are clear. Paranasal sinus mucosal thickening with fluid level or polypoid opacity in the right sphenoid sinus. Visualized orbit soft tissues are within normal limits. Normal bone marrow signal. Visualized scalp soft tissues are within normal limits.  IMPRESSION: 1. No evidence of acute infarct. 2. Ventricular prominence with superimposed cavum septum callosum. In the appropriate clinical  setting, normal pressure hydrocephalus could not be excluded. 3. Moderate for age nonspecific signal changes in the brain, most commonly due to chronic small vessel disease. 4. Paranasal sinus inflammation.   Electronically Signed   By: Augusto Gamble M.D.   On: 07/12/2013 10:29   Ct Chest High Resolution  07/10/2013   CLINICAL DATA:  Pulmonary nodules.  EXAM: CHEST CT WITHOUT CONTRAST  TECHNIQUE: Multidetector CT imaging of the chest was performed following the standard protocol without intravenous contrast. High resolution imaging of the lungs, as well as inspiratory and expiratory imaging, was performed.  COMPARISON:  08/15/2010  FINDINGS: The chest wall demonstrates an enlarging nodular mass in the right lateral breast. Recommend correlation with physical exam, mammography and ultrasound. No axillary adenopathy or supraclavicular adenopathy. The bony thorax is intact. Severe osteoporosis and severe degenerative disease involving the spine. Probable remote healed sternal fracture. Remote healed rib fractures are also noted.  The heart is normal in size. No pericardial effusion. There is tortuosity, ectasia and advanced atherosclerotic calcification involving the aorta and branch vessels. There are borderline enlarged mediastinal lymph nodes most notable in the aortic 0 pulmonary window and sub carina. The  esophagus is grossly normal.  Examination of the lung parenchyma demonstrates no worrisome pulmonary nodules or masses. There is progressive peribronchial disease in the left lower lobe and bronchiectasis. No pleural effusion.  The upper abdomen is unremarkable. Severe atherosclerotic disease involving the aorta is noted.  IMPRESSION: Enlarging right upper lateral breast lesion. Recommend correlation with physical exam, ultrasound and mammography.  No worrisome pulmonary masses or nodules.  Worsening left lower lobe peribronchial disease and bronchiectasis.  Advanced atherosclerotic disease involving the aorta and branch vessels.  Borderline enlarged mediastinal lymph nodes.   Electronically Signed   By: Loralie Champagne M.D.   On: 07/10/2013 01:27    Microbiology: Recent Results (from the past 240 hour(s))  URINE CULTURE     Status: None   Collection Time    07/10/13  3:49 PM      Result Value Range Status   Specimen Description URINE, CLEAN CATCH   Final   Special Requests Normal   Final   Culture  Setup Time     Final   Value: 07/10/2013 21:03     Performed at [facility] Count     Final   Value: >=100,000 COLONIES/ML     Performed at [facility]   Culture     Final   Value: ESCHERICHIA COLI     Performed at [facility]   Report Status 07/12/2013 FINAL   Final   Organism ID, Bacteria ESCHERICHIA COLI   Final     Labs: Basic Metabolic Panel:  Recent Labs Lab 07/09/13 1900 07/09/13 2359 07/10/13 0741 07/13/13 0516  NA 135*  --  138 140  K 3.8  --  3.3* 3.4*  CL 95*  --  102 103  CO2 27  --  25 24  GLUCOSE 109*  --  85 90  BUN 11  --  10 7  CREATININE 0.57 0.57 0.58 0.56  CALCIUM 8.7 8.2* 8.0* 8.4  MG  --  1.9  --   --   PHOS  --  2.9  --   --    Liver Function Tests:  Recent Labs Lab 07/09/13 1900  AST 42*  ALT 24  ALKPHOS 68  BILITOT 0.5  PROT 7.2  ALBUMIN 3.4*   No results found for this basename:  LIPASE, AMYLASE,  in the  last 168 hours No results found for this basename: AMMONIA,  in the last 168 hours CBC:  Recent Labs Lab 07/09/13 1900 07/09/13 2359 07/10/13 0741 07/13/13 0516  WBC 7.1 5.7 4.1 6.0  NEUTROABS 5.5  --   --   --   HGB 14.7 12.9 12.1 13.2  HCT 44.0 39.8 36.4 38.9  MCV 97.8 97.3 97.6 95.1  PLT 196 168 151 223   Cardiac Enzymes: No results found for this basename: CKTOTAL, CKMB, CKMBINDEX, TROPONINI,  in the last 168 hours BNP: BNP (last 3 results) No results found for this basename: PROBNP,  in the last 8760 hours CBG: No results found for this basename: GLUCAP,  in the last 168 hours  Time coordinating discharge: 45 minutes  Signed:  Shenee Wignall  [facility] 07/13/2013, 3:24 PM

## 2013-07-13 NOTE — Progress Notes (Signed)
INITIAL NUTRITION ASSESSMENT  DOCUMENTATION CODES Per approved criteria  -Not Applicable   INTERVENTION: Continue with Megace appetite stimulant Continue with lactose free Boost supplement Encouraged PO intake Honored food preferences  NUTRITION DIAGNOSIS: Increased nutrient needs (protein/kcal) related to increased demand for nutrient as evidenced by stage 2 sacral ulcer.   Goal: Pt to meet >/= 90% of their estimated nutrition needs    Monitor:  Total protein/energy intake, skin integrity, labs, weight trends  Reason for Assessment: Consult to Assess  80 y.o. female  Admitting Dx: <principal problem not specified>  ASSESSMENT: This morning at 4 AM she got out of bed and her husband was helping her with the aid of her walker to the restroom. She became weak and "melted" to the floor. Her husband says she did not fall. It was more like she sat down. There was no head injury. The son came to the house within 10 minutes and got his mom back to bed. Patient went back to sleep. Mid morning when she woke up her husband helped her but we'll in her in dealing Rocker to her chair. She spent the day in the chair which is not abnormal for her. However in the late afternoon she was unable to get up. Her son came and tried to get her to stand up but she was unable to even help him to help her get up. At that time he thought she stable more confused and slightly lethargic  -Pt reported slight decrease in appetite during first 1-2 days of admit, but noted that this was improving. Eating approximately 50% of meals -Has refused Boost supplement twice, but encouraged pt to consume supplement to meet est nutrition needs for skin integrity. Pt and pt's husband verbalized understanding, husband noted that he will encourage pt's PO intake -Megace started on 1/05 -Recorded several food preferences, dislikes meats but will consume chicken/fish for protein sources  Height: Ht Readings from Last 1  Encounters:  07/10/13 5\' 2"  (1.575 m)    Weight: Wt Readings from Last 1 Encounters:  07/13/13 121 lb 7.6 oz (55.1 kg)    Ideal Body Weight: 110 lbs  % Ideal Body Weight: 110&  Wt Readings from Last 10 Encounters:  07/13/13 121 lb 7.6 oz (55.1 kg)  08/24/12 131 lb 6.4 oz (59.603 kg)    Usual Body Weight: 120 lbs-denied any wt changes  % Usual Body Weight: 100%  BMI:  Body mass index is 22.21 kg/(m^2).  Estimated Nutritional Needs: Kcal: 1300-1500  Protein: 65-75 gram Fluid: 1700 ml/daily  Skin: Stage II pressure ulcer on sacrum  Diet Order: General  EDUCATION NEEDS: -No education needs identified at this time   Intake/Output Summary (Last 24 hours) at 07/13/13 0853 Last data filed at 07/13/13 0842  Gross per 24 hour  Intake    676 ml  Output    500 ml  Net    176 ml    Last BM: 1/05  Labs:   Recent Labs Lab 07/09/13 1900 07/09/13 2359 07/10/13 0741 07/13/13 0516  NA 135*  --  138 140  K 3.8  --  3.3* 3.4*  CL 95*  --  102 103  CO2 27  --  25 24  BUN 11  --  10 7  CREATININE 0.57 0.57 0.58 0.56  CALCIUM 8.7 8.2* 8.0* 8.4  MG  --  1.9  --   --   PHOS  --  2.9  --   --   GLUCOSE 109*  --  85 90    CBG (last 3)  No results found for this basename: GLUCAP,  in the last 72 hours  Scheduled Meds: . antiseptic oral rinse  15 mL Mouth Rinse BID  . carvedilol  3.125 mg Oral BID WC  . cefTRIAXone (ROCEPHIN)  IV  1 g Intravenous Q1400  . docusate sodium  100 mg Oral BID  . influenza vac split quadrivalent PF  0.5 mL Intramuscular Tomorrow-1000  . lactose free nutrition  237 mL Oral TID WC  . megestrol  400 mg Oral Daily  . pneumococcal 23 valent vaccine  0.5 mL Intramuscular Tomorrow-1000  . sodium chloride  3 mL Intravenous Q12H    Continuous Infusions: . 0.9 % NaCl with KCl 20 mEq / L 20 mL/hr at 07/12/13 2300    Past Medical History  Diagnosis Date  . Arthritis     Past Surgical History  Procedure Laterality Date  . Cholecystectomy     . Abdominal hysterectomy    . Ectopic pregnancy surgery      Lloyd Huger MS RD LDN Clinical Dietitian [phone]

## 2013-07-13 NOTE — Progress Notes (Signed)
Clinical Social Work Department CLINICAL SOCIAL WORK PLACEMENT NOTE 07/13/2013  Patient:  Jeanette Hudson,Jeanette Hudson  Account Number:  [id number] Admit date:  07/09/2013  Clinical Social Worker:  Orpah GreekKELLY FOLEY, LCSWA  Date/time:  07/13/2013 03:41 PM  Clinical Social Work is seeking post-discharge placement for this patient at the following level of care:   SKILLED NURSING   (*CSW will update this form in Epic as items are completed)   07/13/2013  Patient/family provided with [facility] Department of Clinical Social Work's list of facilities offering this level of care within the geographic area requested by the patient (or if unable, by the patient's family).  07/13/2013  Patient/family informed of their freedom to choose among providers that offer the needed level of care, that participate in Medicare, Medicaid or managed care program needed by the patient, have an available bed and are willing to accept the patient.  07/13/2013  Patient/family informed of [facility]' ownership interest in [facility], as well as of the fact that they are under no obligation to receive care at this facility.  PASARR submitted to EDS on 07/13/2013 PASARR number received from EDS on 07/13/2013  FL2 transmitted to all facilities in geographic area requested by pt/family on  07/13/2013 FL2 transmitted to all facilities within larger geographic area on   Patient informed that his/her managed care company has contracts with or will negotiate with  certain facilities, including the following:     Patient/family informed of bed offers received:  07/13/2013 [facility] Physician recommends and patient chooses bed at    [facility]  07/14/2013 Patient to be transferred to facility by   The following physician request were entered in Epic:   Additional Comments:   Unice BaileyKelly Foley, LCSW [facility] Clinical Social Worker cell #: [phone]

## 2013-07-13 NOTE — Progress Notes (Signed)
Attempted to see pt this am.  Pt waiting for LP.  Was seen by PT for NPH reading before LP and will be seen again after.  Will defer this treatment until tomorrow.  Pt not going to SNF. Tory EmeraldHolly Coyle [place], [place] [phone]

## 2013-07-13 NOTE — Progress Notes (Signed)
PHYSICAL THERAPY NOTE- PT was asked to perform a pre LP assessment. Copy of results in soft chart. LP has now been cancelled. PT will continue w/ mobility training.  Blanchard KelchKaren Colsen Modi PT [phone]

## 2013-07-13 NOTE — Progress Notes (Signed)
Clinical Social Work Department BRIEF PSYCHOSOCIAL ASSESSMENT 07/13/2013  Patient:  Jeanette Hudson,Jeanette Hudson     Account Number:  [id number]     Admit date:  07/09/2013  Clinical Social Worker:  Orpah GreekFOLEY,Leeon Makar, LCSWA  Date/Time:  07/13/2013 03:36 PM  Referred by:  Physician  Date Referred:  07/13/2013 Referred for  SNF Placement   Other Referral:   Interview type:  Patient Other interview type:   and husband at bedside    PSYCHOSOCIAL DATA Living Status:  HUSBAND Admitted from facility:   Level of care:   Primary support name:  Jeanette Hudson (husband) ph#: [phone] Primary support relationship to patient:  SPOUSE Degree of support available:   good    CURRENT CONCERNS Current Concerns  Post-Acute Placement   Other Concerns:    SOCIAL WORK ASSESSMENT / PLAN CSW reviewed PT evaluation recommending SNF vs. Home Health for patient at discharge.   Assessment/plan status:  [facility] Other assessment/ plan:   Information/referral to community resources:   CSW completed FL2 and [facility] would be able to accept patient tomorrow (per patient's request).    PATIENT'S/FAMILY'S RESPONSE TO PLAN OF CARE: Patient was hesitant to agree to SNF plan, patient's husband feels though that he would be unable to take care of her at home in her current weakened condition.    Patient & husband are agreeable with plan for SNF tomorrow. CSW will follow-up in the morning.       Unice BaileyKelly Foley, LCSW [facility] Clinical Social Worker cell #: [phone]

## 2013-07-13 NOTE — Progress Notes (Signed)
Physical Therapy Treatment/ based on pre- test for possible NPH Patient Details Name: Jeanette Hudson MRN: [id number] DOB: 11-02-1917 Today's Date: 07/13/2013 Time: [id number] PT Time Calculation (min): 27 min  PT Assessment / Plan / Recommendation  History of Present Illness This is a delightful 80 year old lady who comes to us today with a one-day history of weakness. At baseline she and her husband live alone and their son lives 5 minutes away. She is able to get around with a walker and participate in her ADLs. This morning at 4 AM she got out of bed and her husband was helping her with the aid of her walker to the restroom. She became weak and "melted" to the floor. Her husband says she did not fall. It was more like she sat down. There was no head injury. The son came to the house within 10 minutes and got his mom back to bed. Patient went back to sleep. Mid morning when she woke up her husband helped her but we'll in her in dealing Rocker to her chair. She spent the day in the chair which is not abnormal for her. However in the late afternoon she was unable to get up. Her son came and tried to get her to stand up but she was unable to even help him to help her get up. At that time he thought she stable more confused and slightly lethargic. EMS was called the patient was transported to the emergency room   PT Comments   Pt required frequent cues for safety. Pt's gait hasty and unsteady, wide base of support. Pt is would benefit from ST SNF for balance and safety. Continue PT.  Follow Up Recommendations  SNF     Does the patient have the potential to tolerate intense rehabilitation     Barriers to Discharge        Equipment Recommendations  None recommended by PT    Recommendations for Other Services    Frequency Min 3X/week   Progress towards PT Goals Progress towards PT goals: Progressing toward goals  Plan Discharge plan needs to be updated    Precautions / Restrictions  Precautions Precautions: Fall Precaution Comments: high fall risk   Pertinent Vitals/Pain No c/o    Mobility  Bed Mobility Bed Mobility: Supine to Sit Supine to Sit: 4: Min assist Sit to Supine: 4: Min assist Details for Bed Mobility Assistance: Assist for trunk to upright and LEs off bed.  Transfers Sit to Stand: 4: Min assist;From bed Stand to Sit: To bed;4: Min assist Details for Transfer Assistance: Assist to rise, stabilize, control descent. Pt sat before safely positioned in front of chair despite cues from therapist.  Ambulation/Gait Ambulation/Gait Assistance: 1: +2 Total assist Ambulation/Gait: Patient Percentage: 60% Ambulation Distance (Feet): 20 Feet Assistive device: Rolling walker Ambulation/Gait Assistance Details: Assist to stabilize after standing, pt with wide base, decreased following commands for safety. Gait Pattern: Step-through pattern;Ataxic;Shuffle;Wide base of support Gait velocity: pt is very unsteday, loss of balance when she turned around, let go of RW and held onto wall Cues for safety frequently.    Exercises     PT Diagnosis:    PT Problem List:   PT Treatment Interventions:     PT Goals (current goals can now be found in the care plan section)    Visit Information  Last PT Received On: 07/13/13 Assistance Needed: +2 History of Present Illness: This is a delightful 80 year old lady who comes to us today with a one-day history  of weakness. At baseline she and her husband live alone and their son lives 5 minutes away. She is able to get around with a walker and participate in her ADLs. This morning at 4 AM she got out of bed and her husband was helping her with the aid of her walker to the restroom. She became weak and "melted" to the floor. Her husband says she did not fall. It was more like she sat down. There was no head injury. The son came to the house within 10 minutes and got his mom back to bed. Patient went back to sleep. Mid morning when  she woke up her husband helped her but we'll in her in dealing Rocker to her chair. She spent the day in the chair which is not abnormal for her. However in the late afternoon she was unable to get up. Her son came and tried to get her to stand up but she was unable to even help him to help her get up. At that time he thought she stable more confused and slightly lethargic. EMS was called the patient was transported to the emergency room    Subjective Data      Cognition  Cognition Arousal/Alertness: Awake/alert Behavior During Therapy: Restless;Anxious Overall Cognitive Status: History of cognitive impairments - at baseline Memory: Decreased recall of precautions;Decreased short-term memory    Balance  Balance Balance Assessed: Yes Static Sitting Balance Static Sitting - Balance Support: Bilateral upper extremity supported Static Sitting - Level of Assistance: 3: Mod assist Static Sitting - Comment/# of Minutes: pt listing  to left , decreased control possibly due to not folowing directions and wanting to lie down.  End of Session PT - End of Session Equipment Utilized During Treatment: Gait belt Activity Tolerance: Patient limited by fatigue Patient left: in bed;with call bell/phone within reach;with bed alarm set Nurse Communication: Mobility status   GP     Rada Hay 07/13/2013, 4:30 PM Blanchard Kelch PT [phone]

## 2013-07-14 NOTE — Progress Notes (Signed)
[facility]charged [facility], [facility]. PIV removed no s/s of infiltration or swelling noted.P/y by [facility].

## 2013-07-14 NOTE — Progress Notes (Signed)
Patient is set to discharge to [facility] today. Patient, husband & son at bedside aware. [place]charge [place]. [facility] called for 2p transport pickup (Service Request Id: [id number]).   Clinical Social Work Department CLINICAL SOCIAL WORK PLACEMENT NOTE 07/14/2013  Patient:  Jeanette Hudson,Jeanette Hudson  Account Number:  [id number] Admit date:  07/09/2013  Clinical Social Worker:  Orpah GreekKELLY FOLEY, LCSWA  Date/time:  07/13/2013 03:41 PM  Clinical Social Work is seeking post-discharge placement for this patient at the following level of care:   SKILLED NURSING   (*CSW will update this form in Epic as items are completed)   07/13/2013  Patient/family provided with [facility] Department of Clinical Social Work's list of facilities offering this level of care within the geographic area requested by the patient (or if unable, by the patient's family).  07/13/2013  Patient/family informed of their freedom to choose among providers that offer the needed level of care, that participate in Medicare, Medicaid or managed care program needed by the patient, have an available bed and are willing to accept the patient.  07/13/2013  Patient/family informed of [facility]' ownership interest in [facility], as well as of the fact that they are under no obligation to receive care at this facility.  PASARR submitted to [employer] on 07/13/2013 PASARR number received from [employer] on 07/13/2013  FL2 transmitted to all facilities in geographic area requested by pt/family on  07/13/2013 FL2 transmitted to all facilities within larger geographic area on   Patient informed that his/her managed care company has contracts with or will negotiate with  certain facilities, including the following:     Patient/family informed of bed offers received:  07/13/2013 [facility] Physician recommends and patient chooses bed at    [facility]  07/14/2013 Patient to be transferred to facility by [facility]  The following physician request were entered in Epic:   Additional Comments:   Unice BaileyKelly Foley, LCSW [facility] Clinical Social Worker cell #: [phone]

## 2013-07-14 NOTE — Progress Notes (Signed)
Pt seen and examined at bedside. D/C summary done 07/13/2012 and no changes needed. Discussed with daughter at bedside. Questions answered. Pt is clinically stable for d/c.  Debbora PrestoMAGICK-Vinisha Faxon, MD  [facility] Pager [phone]  If 7PM-7AM, please contact night-coverage [email or web address] Password [email or web address]

## 2013-08-02 ENCOUNTER — Telehealth: Payer: Self-pay

## 2013-08-02 NOTE — Telephone Encounter (Signed)
Vickie from hospice is calling to see see if dr hopper could attend for hospice please call vickie at [phone]

## 2013-08-05 NOTE — Telephone Encounter (Signed)
I have not seen the patient for almost a year. I cannot be the hospice doctor.

## 2013-08-06 NOTE — Telephone Encounter (Signed)
Spoke with lady at hospice--gave her the message that dr hopper could not be the Jeanette Hudson hospice dr for this patient.

## 2013-11-05 DEATH — deceased

## 2015-08-28 IMAGING — CT CT HEAD W/O CM
2 series · 16 of 30 positions shown, 19 images · non-contrast
Comparison: None.

CLINICAL DATA: Trauma with gait difficulty

CT HEAD WITHOUT CONTRAST
TECHNIQUE: Contiguous axial images were obtained from the base of
the skull through the vertex without contrast. Study was performed
within 24 hours of patient arrival at the emergency department.

[Series 2: bone windows · axial · 0.48mm/px · z∈[+1061,+1172]mm · 7 of 57 slices shown]
[im 7/57  bone]
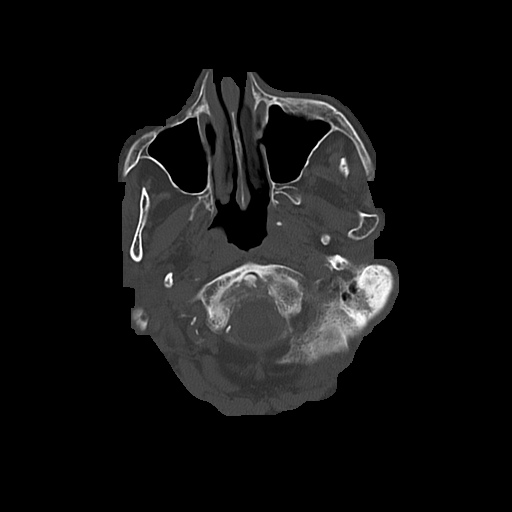
[im 13/57  bone]
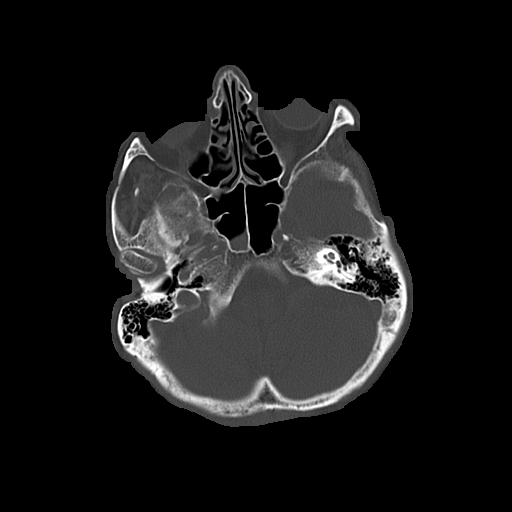
[im 19/57  bone]
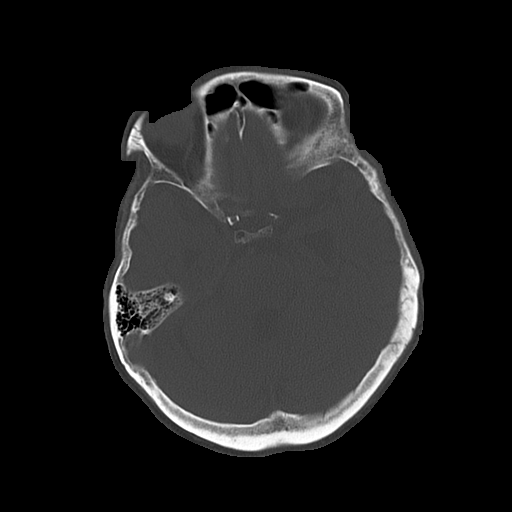
[im 25/57  bone]
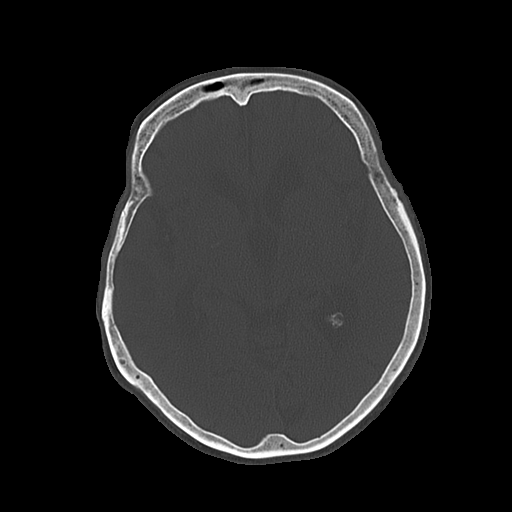
[im 32/57  bone]
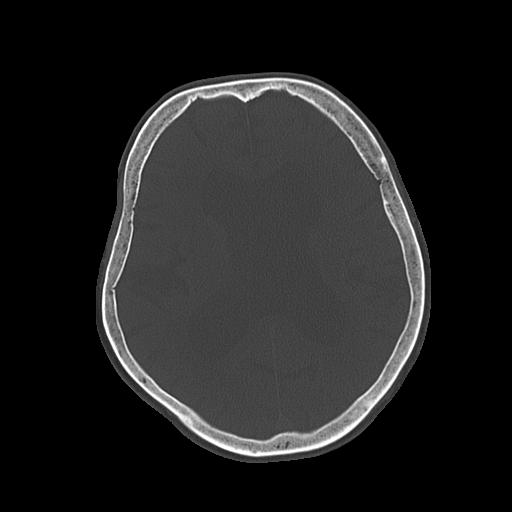
[im 38/57  bone]
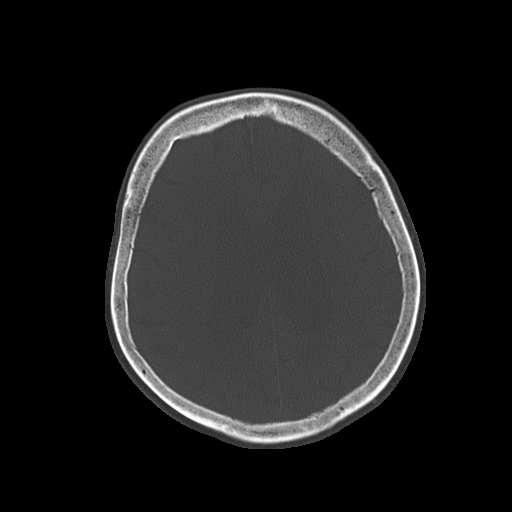
[im 44/57  bone]
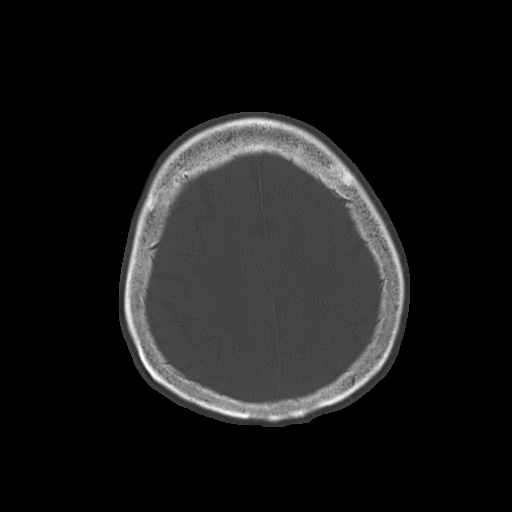

[Series 3: head w/o · axial · non-contrast · 0.48mm/px · z∈[+1059,+1189]mm · 9 of 34 slices shown, 12 images]
[im 4/34  brain]
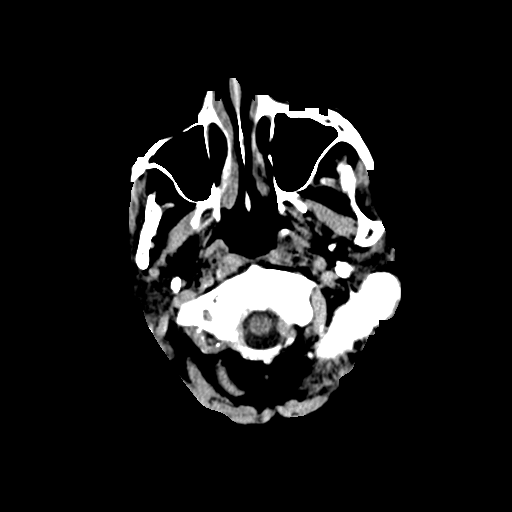
[im 4/34  bone]
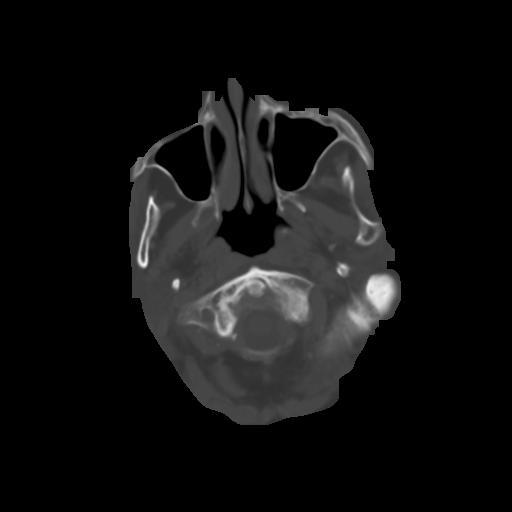
[im 7/34  brain]
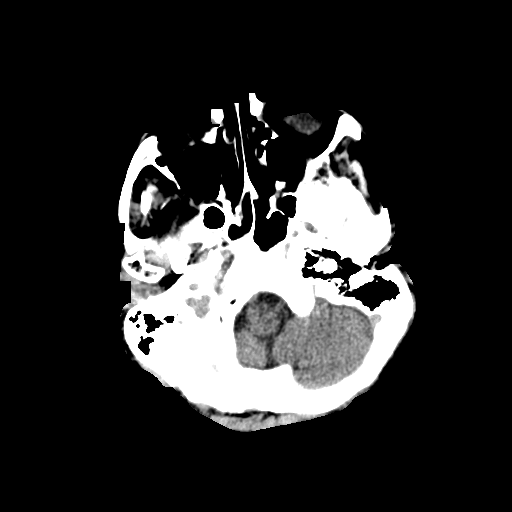
[im 10/34  brain]
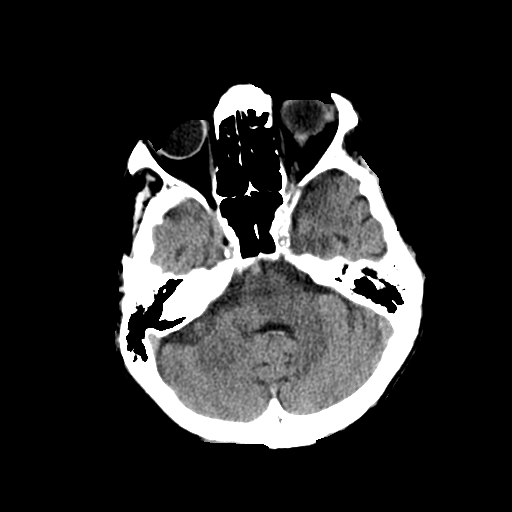
[im 14/34  brain]
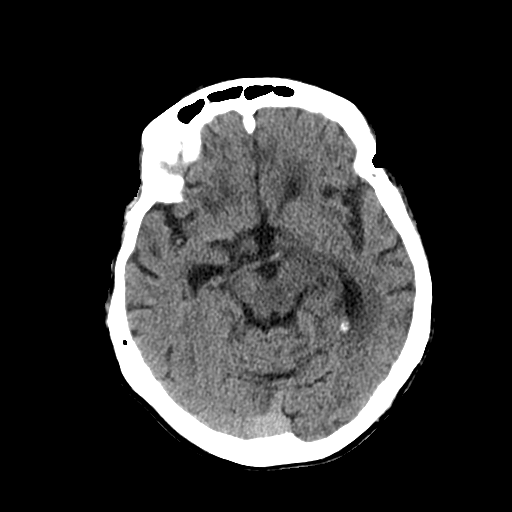
[im 17/34  brain]
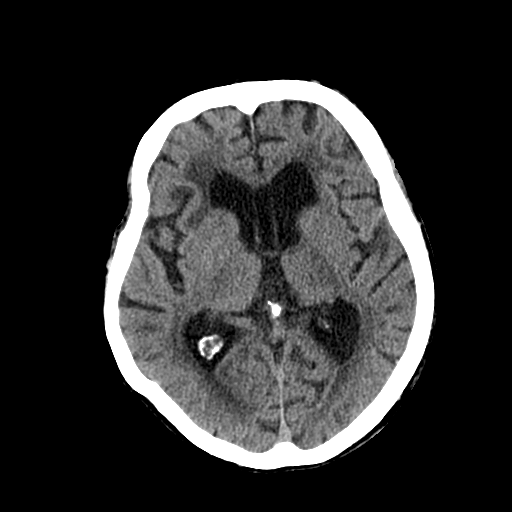
[im 17/34  bone]
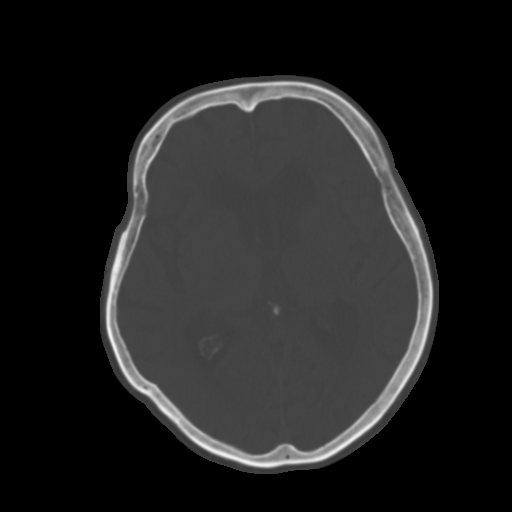
[im 20/34  brain]
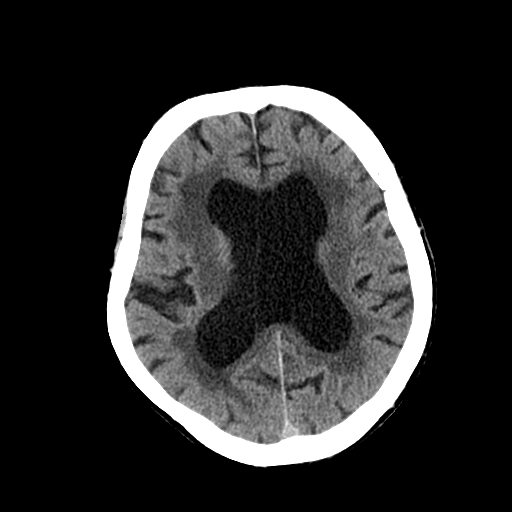
[im 24/34  brain]
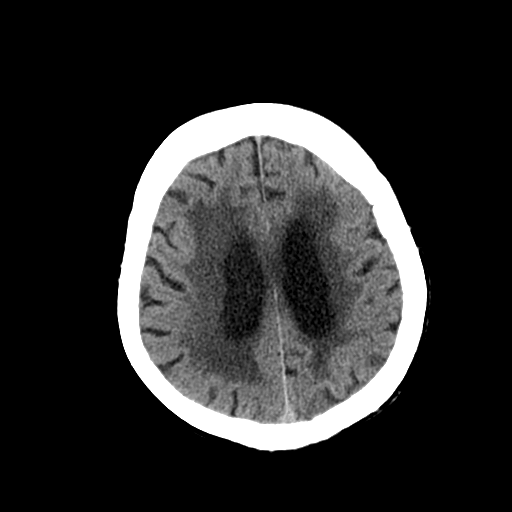
[im 27/34  brain]
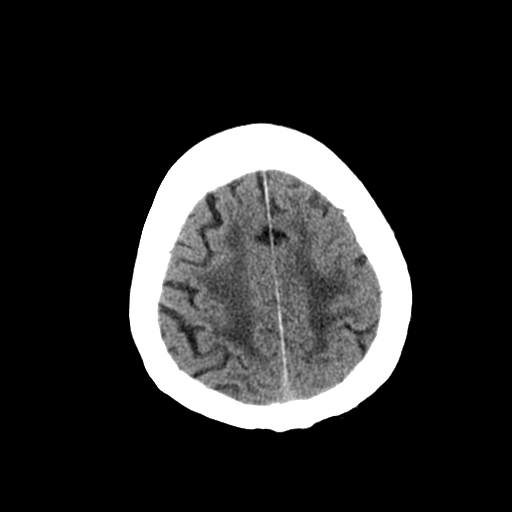
[im 30/34  brain]
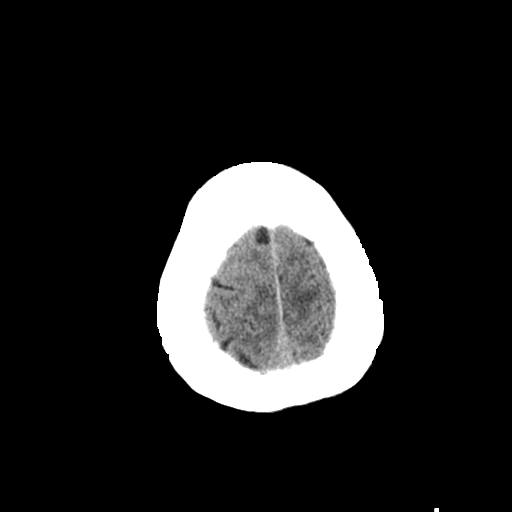
[im 30/34  bone]
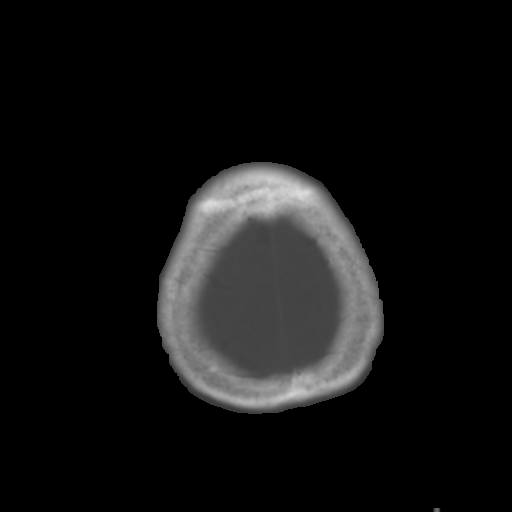

[16 of 30 positions shown; findings below may reference images not displayed]

FINDINGS: There is moderate diffuse atrophy.  There is a cavum
septum pellucidum, an anatomic variant.  There is no mass,
hemorrhage, extra-axial fluid collection, or midline shift.  There
is widespread small vessel disease throughout the supratentorial
white matter as well as throughout the pons and midbrain no
regions.  No acute appearing infarct is seen on this study.

Bony calvarium appears intact.  Mastoid air cells are clear.
IMPRESSION: Atrophy with widespread supratentorial and
infratentorial small vessel disease.  No acute appearing infarct is
appreciable on this study.  Small recent infarct could be obscured
by this degree of diffuse small vessel disease.  No intracranial
hemorrhage or mass effect.

## 2015-12-25 IMAGING — CR DG CHEST 2V
2 series · 2 of 2 positions shown · non-contrast
Comparison: 08/15/2010

CLINICAL DATA: Fall, nonsmoker

EXAM:
CHEST  2 VIEW

[w chest lat]
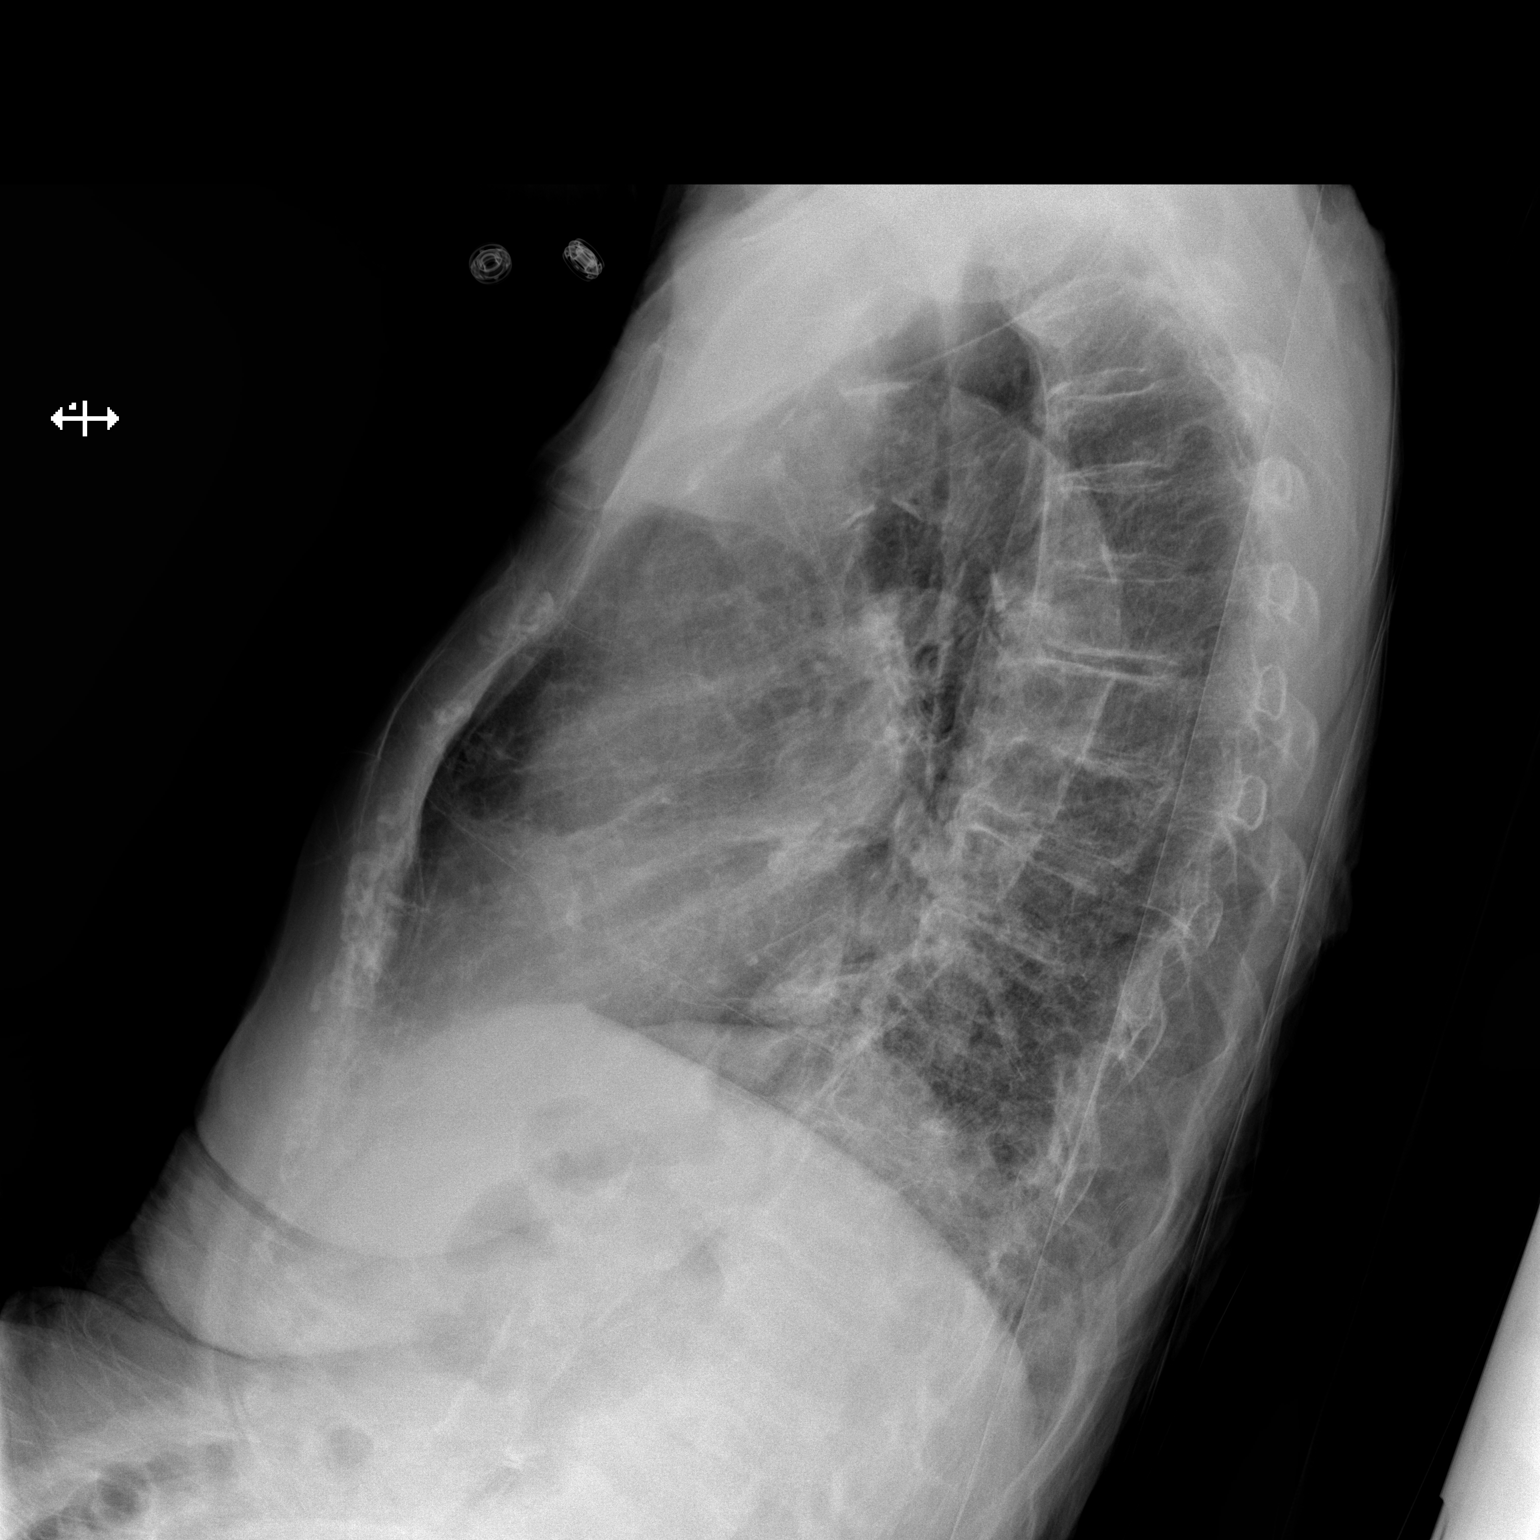

[x chest ap]
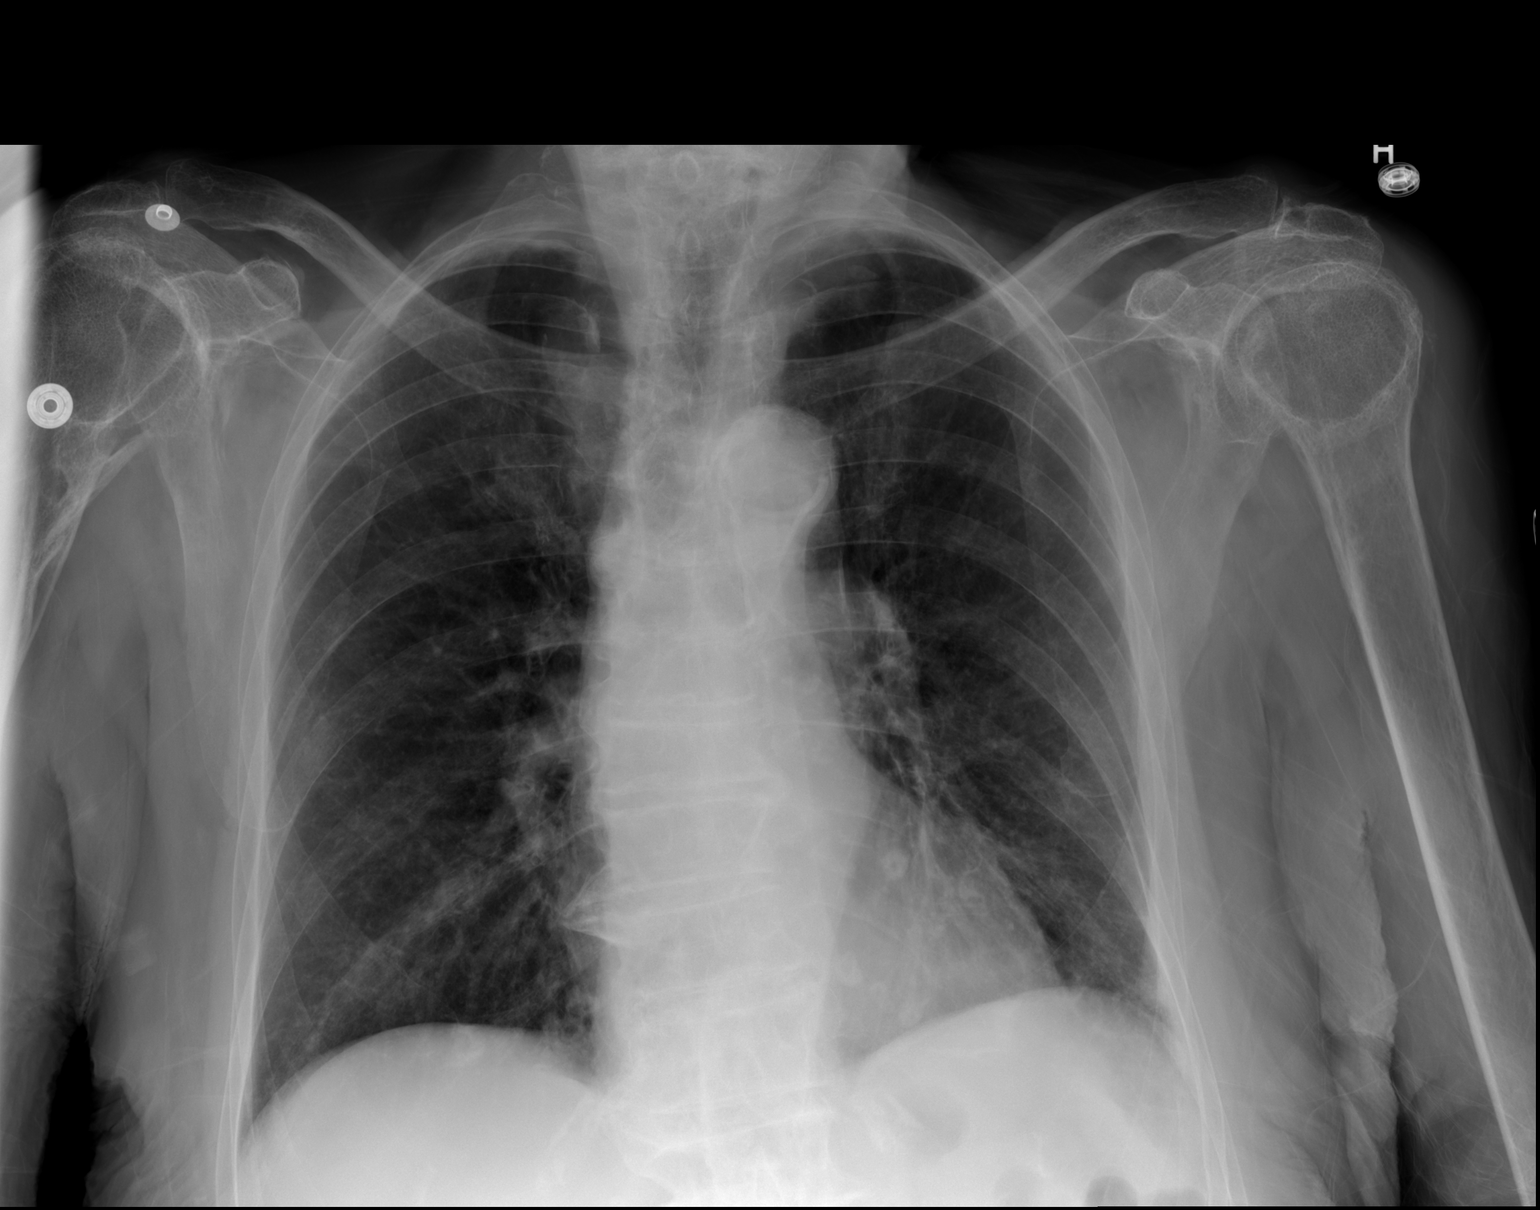

[2 of 2 positions shown; findings below may reference images not displayed]

FINDINGS: Heart size is normal. Vascular pattern is normal. There is aortic
calcification. No consolidation or effusion. In the retrocardiac
left lower lobe there are 2 nodular opacities which are not seen on
the prior study, both measuring about 7 mm.
IMPRESSION: No acute findings. However, cannot exclude pulmonary nodules in the
left lower lobe. Consider CT thorax.
